# Patient Record
Sex: Female | Born: 1955 | Race: White | Hispanic: No | Marital: Married | Smoking: Former smoker
Health system: Southern US, Community
[De-identification: ages and names within clinical notes are randomized; demographics above are authoritative.]

## PROBLEM LIST (undated history)

## (undated) DIAGNOSIS — N903 Dysplasia of vulva, unspecified: Secondary | ICD-10-CM

## (undated) DIAGNOSIS — S060X9A Concussion with loss of consciousness of unspecified duration, initial encounter: Secondary | ICD-10-CM

## (undated) DIAGNOSIS — S6990XA Unspecified injury of unspecified wrist, hand and finger(s), initial encounter: Secondary | ICD-10-CM

## (undated) DIAGNOSIS — R079 Chest pain, unspecified: Secondary | ICD-10-CM

## (undated) DIAGNOSIS — S5400XA Injury of ulnar nerve at forearm level, unspecified arm, initial encounter: Secondary | ICD-10-CM

## (undated) DIAGNOSIS — S060XAA Concussion with loss of consciousness status unknown, initial encounter: Secondary | ICD-10-CM

## (undated) DIAGNOSIS — S62109A Fracture of unspecified carpal bone, unspecified wrist, initial encounter for closed fracture: Secondary | ICD-10-CM

## (undated) DIAGNOSIS — E7800 Pure hypercholesterolemia, unspecified: Secondary | ICD-10-CM

## (undated) HISTORY — PX: LASER ABLATION OF THE CERVIX: SHX1949

## (undated) HISTORY — PX: OTHER SURGICAL HISTORY: SHX169

## (undated) HISTORY — DX: Concussion with loss of consciousness status unknown, initial encounter: S06.0XAA

## (undated) HISTORY — PX: TONSILLECTOMY AND ADENOIDECTOMY: SUR1326

## (undated) HISTORY — DX: Unspecified injury of unspecified wrist, hand and finger(s), initial encounter: S69.90XA

## (undated) HISTORY — DX: Fracture of unspecified carpal bone, unspecified wrist, initial encounter for closed fracture: S62.109A

## (undated) HISTORY — DX: Concussion with loss of consciousness of unspecified duration, initial encounter: S06.0X9A

## (undated) HISTORY — DX: Injury of ulnar nerve at forearm level, unspecified arm, initial encounter: S54.00XA

## (undated) HISTORY — PX: APPENDECTOMY: SHX54

## (undated) HISTORY — DX: Pure hypercholesterolemia, unspecified: E78.00

## (undated) HISTORY — DX: Chest pain, unspecified: R07.9

## (undated) HISTORY — PX: COLONOSCOPY: SHX174

## (undated) HISTORY — DX: Dysplasia of vulva, unspecified: N90.3

---

## 1999-10-20 ENCOUNTER — Encounter: Payer: Self-pay | Admitting: Obstetrics and Gynecology

## 1999-10-20 ENCOUNTER — Encounter: Admission: RE | Admit: 1999-10-20 | Discharge: 1999-10-20 | Payer: Self-pay | Admitting: Obstetrics and Gynecology

## 1999-11-18 ENCOUNTER — Encounter (INDEPENDENT_AMBULATORY_CARE_PROVIDER_SITE_OTHER): Payer: Self-pay | Admitting: Specialist

## 1999-11-18 ENCOUNTER — Other Ambulatory Visit: Admission: RE | Admit: 1999-11-18 | Discharge: 1999-11-18 | Payer: Self-pay | Admitting: Obstetrics and Gynecology

## 2004-02-05 ENCOUNTER — Other Ambulatory Visit: Admission: RE | Admit: 2004-02-05 | Discharge: 2004-02-05 | Payer: Self-pay | Admitting: Obstetrics and Gynecology

## 2004-02-08 ENCOUNTER — Encounter: Admission: RE | Admit: 2004-02-08 | Discharge: 2004-02-08 | Payer: Self-pay | Admitting: Obstetrics and Gynecology

## 2005-03-02 ENCOUNTER — Encounter: Admission: RE | Admit: 2005-03-02 | Discharge: 2005-03-02 | Payer: Self-pay | Admitting: Obstetrics and Gynecology

## 2005-03-09 ENCOUNTER — Other Ambulatory Visit: Admission: RE | Admit: 2005-03-09 | Discharge: 2005-03-09 | Payer: Self-pay | Admitting: Obstetrics and Gynecology

## 2006-12-26 ENCOUNTER — Encounter: Admission: RE | Admit: 2006-12-26 | Discharge: 2006-12-26 | Payer: Self-pay | Admitting: Obstetrics and Gynecology

## 2007-01-02 ENCOUNTER — Other Ambulatory Visit: Admission: RE | Admit: 2007-01-02 | Discharge: 2007-01-02 | Payer: Self-pay | Admitting: Obstetrics and Gynecology

## 2009-01-27 ENCOUNTER — Encounter: Admission: RE | Admit: 2009-01-27 | Discharge: 2009-01-27 | Payer: Self-pay | Admitting: Obstetrics and Gynecology

## 2009-02-01 ENCOUNTER — Other Ambulatory Visit: Admission: RE | Admit: 2009-02-01 | Discharge: 2009-02-01 | Payer: Self-pay | Admitting: Obstetrics and Gynecology

## 2010-04-14 ENCOUNTER — Ambulatory Visit (HOSPITAL_COMMUNITY): Admission: RE | Admit: 2010-04-14 | Discharge: 2010-04-14 | Payer: Self-pay | Admitting: Internal Medicine

## 2010-04-14 ENCOUNTER — Encounter: Admission: RE | Admit: 2010-04-14 | Discharge: 2010-04-14 | Payer: Self-pay | Admitting: Internal Medicine

## 2010-06-21 ENCOUNTER — Other Ambulatory Visit
Admission: RE | Admit: 2010-06-21 | Discharge: 2010-06-21 | Payer: Self-pay | Source: Home / Self Care | Admitting: Obstetrics and Gynecology

## 2010-07-07 ENCOUNTER — Encounter
Admission: RE | Admit: 2010-07-07 | Discharge: 2010-07-07 | Payer: Self-pay | Source: Home / Self Care | Attending: Obstetrics and Gynecology | Admitting: Obstetrics and Gynecology

## 2011-06-14 ENCOUNTER — Other Ambulatory Visit: Payer: Self-pay | Admitting: Family Medicine

## 2011-06-14 ENCOUNTER — Other Ambulatory Visit (HOSPITAL_COMMUNITY)
Admission: RE | Admit: 2011-06-14 | Discharge: 2011-06-14 | Disposition: A | Discharge status: Home / Self Care | Payer: BC Managed Care – PPO | Source: Ambulatory Visit | Attending: Family Medicine | Admitting: Family Medicine

## 2011-06-14 DIAGNOSIS — Z1159 Encounter for screening for other viral diseases: Secondary | ICD-10-CM | POA: Insufficient documentation

## 2011-06-14 DIAGNOSIS — Z124 Encounter for screening for malignant neoplasm of cervix: Secondary | ICD-10-CM | POA: Insufficient documentation

## 2011-07-05 ENCOUNTER — Other Ambulatory Visit: Payer: Self-pay | Admitting: Family Medicine

## 2011-07-05 DIAGNOSIS — Z1231 Encounter for screening mammogram for malignant neoplasm of breast: Secondary | ICD-10-CM

## 2011-07-25 ENCOUNTER — Ambulatory Visit
Admission: RE | Admit: 2011-07-25 | Discharge: 2011-07-25 | Disposition: A | Discharge status: Home / Self Care | Payer: BC Managed Care – PPO | Source: Ambulatory Visit | Attending: Family Medicine | Admitting: Family Medicine

## 2011-07-25 DIAGNOSIS — Z1231 Encounter for screening mammogram for malignant neoplasm of breast: Secondary | ICD-10-CM

## 2012-07-03 ENCOUNTER — Other Ambulatory Visit: Payer: Self-pay | Admitting: Family Medicine

## 2012-07-03 ENCOUNTER — Other Ambulatory Visit (HOSPITAL_COMMUNITY)
Admission: RE | Admit: 2012-07-03 | Discharge: 2012-07-03 | Disposition: A | Discharge status: Home / Self Care | Payer: 59 | Source: Ambulatory Visit | Attending: Family Medicine | Admitting: Family Medicine

## 2012-07-03 DIAGNOSIS — Z124 Encounter for screening for malignant neoplasm of cervix: Secondary | ICD-10-CM | POA: Insufficient documentation

## 2012-09-10 ENCOUNTER — Other Ambulatory Visit: Payer: Self-pay

## 2012-09-10 DIAGNOSIS — Z1231 Encounter for screening mammogram for malignant neoplasm of breast: Secondary | ICD-10-CM

## 2012-10-09 ENCOUNTER — Ambulatory Visit
Admission: RE | Admit: 2012-10-09 | Discharge: 2012-10-09 | Disposition: A | Discharge status: Home / Self Care | Payer: 59 | Source: Ambulatory Visit

## 2012-10-09 DIAGNOSIS — Z1231 Encounter for screening mammogram for malignant neoplasm of breast: Secondary | ICD-10-CM

## 2013-07-07 ENCOUNTER — Other Ambulatory Visit: Payer: Self-pay | Admitting: Family Medicine

## 2013-07-07 ENCOUNTER — Other Ambulatory Visit (HOSPITAL_COMMUNITY)
Admission: RE | Admit: 2013-07-07 | Discharge: 2013-07-07 | Disposition: A | Discharge status: Home / Self Care | Payer: 59 | Source: Ambulatory Visit | Attending: Family Medicine | Admitting: Family Medicine

## 2013-07-07 DIAGNOSIS — Z1151 Encounter for screening for human papillomavirus (HPV): Secondary | ICD-10-CM | POA: Insufficient documentation

## 2013-07-07 DIAGNOSIS — Z124 Encounter for screening for malignant neoplasm of cervix: Secondary | ICD-10-CM | POA: Insufficient documentation

## 2014-01-29 ENCOUNTER — Other Ambulatory Visit: Payer: Self-pay

## 2014-01-29 DIAGNOSIS — Z1231 Encounter for screening mammogram for malignant neoplasm of breast: Secondary | ICD-10-CM

## 2014-02-02 ENCOUNTER — Ambulatory Visit
Admission: RE | Admit: 2014-02-02 | Discharge: 2014-02-02 | Disposition: A | Discharge status: Home / Self Care | Payer: 59 | Source: Ambulatory Visit

## 2014-02-02 DIAGNOSIS — Z1231 Encounter for screening mammogram for malignant neoplasm of breast: Secondary | ICD-10-CM

## 2014-02-09 ENCOUNTER — Other Ambulatory Visit: Payer: Self-pay | Admitting: Physician Assistant

## 2014-02-09 DIAGNOSIS — R109 Unspecified abdominal pain: Secondary | ICD-10-CM

## 2014-02-11 ENCOUNTER — Encounter (INDEPENDENT_AMBULATORY_CARE_PROVIDER_SITE_OTHER): Payer: Self-pay

## 2014-02-11 ENCOUNTER — Ambulatory Visit
Admission: RE | Admit: 2014-02-11 | Discharge: 2014-02-11 | Disposition: A | Discharge status: Home / Self Care | Payer: 59 | Source: Ambulatory Visit | Attending: Physician Assistant | Admitting: Physician Assistant

## 2014-02-11 DIAGNOSIS — R109 Unspecified abdominal pain: Secondary | ICD-10-CM

## 2014-02-27 ENCOUNTER — Other Ambulatory Visit: Payer: Self-pay | Admitting: Obstetrics & Gynecology

## 2014-07-01 ENCOUNTER — Other Ambulatory Visit: Payer: Self-pay | Admitting: Obstetrics & Gynecology

## 2014-07-01 ENCOUNTER — Other Ambulatory Visit (HOSPITAL_COMMUNITY)
Admission: RE | Admit: 2014-07-01 | Discharge: 2014-07-01 | Disposition: A | Discharge status: Home / Self Care | Payer: Managed Care, Other (non HMO) | Source: Ambulatory Visit | Attending: Obstetrics & Gynecology | Admitting: Obstetrics & Gynecology

## 2014-07-01 DIAGNOSIS — Z01419 Encounter for gynecological examination (general) (routine) without abnormal findings: Secondary | ICD-10-CM | POA: Insufficient documentation

## 2014-07-02 LAB — CYTOLOGY - PAP

## 2015-01-20 ENCOUNTER — Telehealth (HOSPITAL_COMMUNITY): Payer: Self-pay

## 2015-01-20 ENCOUNTER — Encounter: Payer: Self-pay | Admitting: Cardiology

## 2015-01-20 ENCOUNTER — Ambulatory Visit (INDEPENDENT_AMBULATORY_CARE_PROVIDER_SITE_OTHER): Payer: Managed Care, Other (non HMO) | Admitting: Cardiology

## 2015-01-20 VITALS — BP 120/70 | HR 52 | Ht 67.0 in | Wt 136.2 lb

## 2015-01-20 DIAGNOSIS — R001 Bradycardia, unspecified: Secondary | ICD-10-CM

## 2015-01-20 DIAGNOSIS — I341 Nonrheumatic mitral (valve) prolapse: Secondary | ICD-10-CM | POA: Diagnosis not present

## 2015-01-20 DIAGNOSIS — R002 Palpitations: Secondary | ICD-10-CM

## 2015-01-20 DIAGNOSIS — R079 Chest pain, unspecified: Secondary | ICD-10-CM | POA: Diagnosis not present

## 2015-01-20 DIAGNOSIS — Z8249 Family history of ischemic heart disease and other diseases of the circulatory system: Secondary | ICD-10-CM | POA: Diagnosis not present

## 2015-01-20 DIAGNOSIS — E785 Hyperlipidemia, unspecified: Secondary | ICD-10-CM | POA: Insufficient documentation

## 2015-01-20 NOTE — Progress Notes (Signed)
Cardiology Office Note   Date:  01/20/2015   ID:  Carol Kerr, DOB 1955-10-31, MRN [id number]  PCP:  No primary care provider on file.    Chief Complaint  Patient presents with  . New Evaluation    Family Hx of premature CAD      History of Present Illness: Carol Kerr is a 59 y.o. female who presents for evaluation of atypical chest pain.  She has a family history of premature CAD with her mom having an MI at 67.  She also has a brother who had an MI at 10.  She has a history of dyslipidemia and has a remote history of tobacco use.  She describes her pain as sharp and intermittent. She says that she only had the pain on 1 occasion.  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  She went to the school nurse and BP was 140/26mmhg. It was sharp and took her breath away.  She says it was very severe and was hard to take a deep breath in.  She became diaphoretic but no nausea.  She went to see her PCP.  She has not had any further CP.  She denies any SOB or DOE.  She runs 2-3 times weekly 3-6 miles and never has any problems doing that.  She has noticed on occasion that when she is running or walking her legs will feel weak.  She denies any LE edema.  She rarely has some skipped heart beats and was diagnosed with MVP years ago.  The palpitations occur a few times weekly.      Past Medical History  Diagnosis Date  . Hypercholesterolemia   . Vulvar dysplasia   . Concussion     FROM A FALL  . 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 Finger injury     FROM A FALL  . Fracture of wrist   . Chest pain     Past Surgical History  Procedure Laterality Date  . Appendectomy    . Tonsillectomy and adenoidectomy    . Laser ablation of the cervix    . Laser to vular    . Colonoscopy       Current Outpatient Prescriptions  Medication Sig Dispense Refill  . Multiple Vitamin (MULTIVITAMIN) tablet Take 1 tablet by mouth daily.    Marland Kitchen triamcinolone cream (KENALOG) 0.1 %  Apply 1 application topically daily.     No current facility-administered medications for this visit.    Allergies:   Statins and Xylocaine    Social History:  The patient  reports that she quit smoking about 43 years ago. Her smoking use included Cigarettes. She does not have any smokeless tobacco history on file. She reports that she drinks alcohol.   Family History:  The patient's family history includes Cancer in her father and maternal grandmother; Heart attack in her brother and mother; Hypertension in her mother.    ROS:  Please see the history of present illness.   Otherwise, review of systems are positive for none.   All other systems are reviewed and negative.    PHYSICAL EXAM: VS:  BP 120/70 mmHg  Pulse 52  Ht  (1.702 m)  Wt 136 lb 3.2 oz (61.78 kg)  BMI 21.33 kg/m2 , BMI Body mass index is 21.33 kg/(m^2). GEN: Well nourished, well developed, in no acute distress  HEENT: normal Neck: no JVD, carotid bruits, or masses Cardiac: RRR; no murmurs, rubs, or gallops,no edema  Respiratory:  clear to auscultation bilaterally, normal work of breathing GI: soft, nontender, nondistended, + BS MS: no deformity or atrophy Skin: warm and dry, no rash Neuro:  Strength and sensation are intact Psych: euthymic mood, full affect   EKG:  EKG is ordered today. The ekg ordered today demonstrates sinus bradycardia at 52bpm with no ST changes   Recent Labs: No results found for requested labs within last 365 days.    Lipid Panel No results found for: CHOL, TRIG, HDL, CHOLHDL, VLDL, LDLCALC, LDLDIRECT    Wt Readings from Last 3 Encounters:  01/20/15 136 lb 3.2 oz (61.78 kg)      ASSESSMENT AND PLAN:  1.  Atypical chest pain that is right sided.  EKG is nonischemic.  She does have risk factors including HTN, dyslipidemia and strong family history of premature CAD.  I will get a stress myoview to rule out ischemia.  Check 2D echo to evaluate LVF. 2.  Dyslipidemia - statin  intolerant - followed by PCP.  She would like to have an NMR panel done so I will order this. 3.  Asymptomatic bradycardia 4.  Palpitations - these occur a few times weekly - I will get a 30 day monitor to rule out PAF    Current medicines are reviewed at length with the patient today.  The patient does not have concerns regarding medicines.  The following changes have been made:  no change  Labs/ tests ordered today: See above Assessment and Plan No orders of the defined types were placed in this encounter.     Disposition:   FU with me in 1 year if studies are normal  Harlon Flor, MD  01/20/2015 12:01 PM    [facility] [address] [place], [place], [place]  [address] Phone: [phone]; Fax: [phone]

## 2015-01-20 NOTE — Telephone Encounter (Signed)
Patient given detailed instructions per Myocardial Perfusion Study Information Sheet for test on 01-25-2015 at 0845. Patient Notified to arrive 15 minutes early, and that it is imperative to arrive on time for appointment to keep from having the test rescheduled. Patient verbalized understanding. Randa Evens, Mirta Mally A

## 2015-01-20 NOTE — Patient Instructions (Signed)
Medication Instructions:  Your physician recommends that you continue on your current medications as directed. Please refer to the Current Medication list given to you today.   Labwork: You will get FASTING LAB WORK the day of your stress test. (NMR)  Testing/Procedures: Your physician has requested that you have an echocardiogram. Echocardiography is a painless test that uses sound waves to create images of your heart. It provides your doctor with information about the size and shape of your heart and how well your heart's chambers and valves are working. This procedure takes approximately one hour. There are no restrictions for this procedure.   Dr. Mayford Knife recommends you have an EXERCISE MYOVIEW.  Your physician has recommended that you wear an event monitor. Event monitors are medical devices that record the heart's electrical activity. Doctors most often [place] these monitors to diagnose arrhythmias. Arrhythmias are problems with the speed or rhythm of the heartbeat. The monitor is a small, portable device. You can wear one while you do your normal daily activities. This is usually used to diagnose what is causing palpitations/syncope (passing out).  Follow-Up: Your physician wants you to follow-up in: 1 year with Dr. Mayford Knife. You will receive a reminder letter in the mail two months in advance. If you don't receive a letter, please call our office to schedule the follow-up appointment.   Any Other Special Instructions Will Be Listed Below (If Applicable).

## 2015-01-21 ENCOUNTER — Other Ambulatory Visit: Payer: Self-pay | Admitting: Cardiology

## 2015-01-21 DIAGNOSIS — R001 Bradycardia, unspecified: Secondary | ICD-10-CM

## 2015-01-21 DIAGNOSIS — R002 Palpitations: Secondary | ICD-10-CM

## 2015-01-25 ENCOUNTER — Other Ambulatory Visit (INDEPENDENT_AMBULATORY_CARE_PROVIDER_SITE_OTHER): Payer: Managed Care, Other (non HMO) | Admitting: *Deleted

## 2015-01-25 ENCOUNTER — Ambulatory Visit (INDEPENDENT_AMBULATORY_CARE_PROVIDER_SITE_OTHER): Payer: Managed Care, Other (non HMO)

## 2015-01-25 ENCOUNTER — Ambulatory Visit (HOSPITAL_COMMUNITY): Payer: Managed Care, Other (non HMO) | Attending: Cardiovascular Disease

## 2015-01-25 ENCOUNTER — Encounter (HOSPITAL_COMMUNITY): Payer: Managed Care, Other (non HMO)

## 2015-01-25 ENCOUNTER — Other Ambulatory Visit: Payer: Self-pay

## 2015-01-25 DIAGNOSIS — E785 Hyperlipidemia, unspecified: Secondary | ICD-10-CM | POA: Diagnosis not present

## 2015-01-25 DIAGNOSIS — R002 Palpitations: Secondary | ICD-10-CM | POA: Diagnosis not present

## 2015-01-25 DIAGNOSIS — I341 Nonrheumatic mitral (valve) prolapse: Secondary | ICD-10-CM | POA: Diagnosis not present

## 2015-01-25 DIAGNOSIS — R001 Bradycardia, unspecified: Secondary | ICD-10-CM | POA: Diagnosis not present

## 2015-01-26 ENCOUNTER — Telehealth (HOSPITAL_COMMUNITY): Payer: Self-pay | Admitting: *Deleted

## 2015-01-26 ENCOUNTER — Telehealth: Payer: Self-pay

## 2015-01-26 DIAGNOSIS — R079 Chest pain, unspecified: Secondary | ICD-10-CM

## 2015-01-26 NOTE — Telephone Encounter (Signed)
Myoview denied by insurance.  Per Dr. Mayford Knife, GXT ordered instead.

## 2015-01-27 ENCOUNTER — Encounter: Payer: Self-pay | Admitting: Cardiology

## 2015-01-27 LAB — NMR LIPOPROFILE WITH LIPIDS
Cholesterol, Total: 222 mg/dL — ABNORMAL HIGH (ref 100–199)
HDL Particle Number: 35.2 umol/L (ref >=30.5)
HDL Size: 9.8 nm (ref >=9.2)
HDL-C: 74 mg/dL (ref >39)
LDL (calc): 135 mg/dL — ABNORMAL HIGH (ref 0–99)
LDL Particle Number: 1215 nmol/L — ABNORMAL HIGH (ref <1000)
LDL Size: 21.9 nm (ref >=20.8)
LP-IR Score: <25 (ref <=45)
Large HDL-P: 12.1 umol/L (ref >=4.8)
Large VLDL-P: 1.8 nmol/L (ref <=2.7)
Small LDL Particle Number: 129 nmol/L (ref <=527)
Triglycerides: 65 mg/dL (ref 0–149)
VLDL Size: 40.6 nm (ref <=46.6)

## 2015-01-28 ENCOUNTER — Encounter: Payer: Self-pay | Admitting: Cardiology

## 2015-01-28 NOTE — Telephone Encounter (Signed)
This encounter was created in error - please disregard.

## 2015-01-28 NOTE — Telephone Encounter (Signed)
Follow up  Pt returning Katy's phone call concerning lab results./ If possible please call back after 2:15pm (8/4)  pt will be available then.

## 2015-02-03 NOTE — Telephone Encounter (Deleted)
Error

## 2015-02-16 ENCOUNTER — Telehealth (HOSPITAL_COMMUNITY): Payer: Self-pay

## 2015-02-16 NOTE — Telephone Encounter (Signed)
Encounter complete. 

## 2015-02-17 ENCOUNTER — Telehealth (HOSPITAL_COMMUNITY): Payer: Self-pay

## 2015-02-17 NOTE — Telephone Encounter (Signed)
Encounter complete. 

## 2015-02-18 ENCOUNTER — Ambulatory Visit (HOSPITAL_COMMUNITY)
Admission: RE | Admit: 2015-02-18 | Discharge: 2015-02-18 | Disposition: A | Discharge status: Home / Self Care | Payer: Managed Care, Other (non HMO) | Source: Ambulatory Visit | Attending: Cardiology | Admitting: Cardiology

## 2015-02-18 DIAGNOSIS — R079 Chest pain, unspecified: Secondary | ICD-10-CM | POA: Diagnosis not present

## 2015-02-18 LAB — EXERCISE TOLERANCE TEST
Estimated workload: 17.2 METS
Exercise duration (min): 15 min
Exercise duration (sec): 1 s
MPHR: 162 {beats}/min
Peak HR: 155 {beats}/min
Percent HR: 95 %
RPE: 15
Rest HR: 51 {beats}/min

## 2015-05-03 ENCOUNTER — Other Ambulatory Visit: Payer: Self-pay

## 2015-05-03 DIAGNOSIS — Z1231 Encounter for screening mammogram for malignant neoplasm of breast: Secondary | ICD-10-CM

## 2015-05-26 ENCOUNTER — Ambulatory Visit
Admission: RE | Admit: 2015-05-26 | Discharge: 2015-05-26 | Disposition: A | Discharge status: Home / Self Care | Payer: Managed Care, Other (non HMO) | Source: Ambulatory Visit

## 2015-05-26 DIAGNOSIS — Z1231 Encounter for screening mammogram for malignant neoplasm of breast: Secondary | ICD-10-CM

## 2015-05-27 ENCOUNTER — Other Ambulatory Visit: Payer: Self-pay | Admitting: Family Medicine

## 2015-05-27 DIAGNOSIS — R928 Other abnormal and inconclusive findings on diagnostic imaging of breast: Secondary | ICD-10-CM

## 2015-06-02 ENCOUNTER — Other Ambulatory Visit: Payer: Managed Care, Other (non HMO)

## 2015-06-02 ENCOUNTER — Ambulatory Visit
Admission: RE | Admit: 2015-06-02 | Discharge: 2015-06-02 | Disposition: A | Discharge status: Home / Self Care | Payer: Managed Care, Other (non HMO) | Source: Ambulatory Visit | Attending: Family Medicine | Admitting: Family Medicine

## 2015-06-02 DIAGNOSIS — R928 Other abnormal and inconclusive findings on diagnostic imaging of breast: Secondary | ICD-10-CM

## 2016-07-14 ENCOUNTER — Other Ambulatory Visit: Payer: Self-pay | Admitting: Family Medicine

## 2016-07-14 DIAGNOSIS — Z1231 Encounter for screening mammogram for malignant neoplasm of breast: Secondary | ICD-10-CM

## 2016-08-10 ENCOUNTER — Ambulatory Visit
Admission: RE | Admit: 2016-08-10 | Discharge: 2016-08-10 | Disposition: A | Discharge status: Home / Self Care | Payer: Managed Care, Other (non HMO) | Source: Ambulatory Visit | Attending: Family Medicine | Admitting: Family Medicine

## 2016-08-10 DIAGNOSIS — Z1231 Encounter for screening mammogram for malignant neoplasm of breast: Secondary | ICD-10-CM

## 2017-07-31 ENCOUNTER — Other Ambulatory Visit: Payer: Self-pay | Admitting: Family Medicine

## 2017-07-31 DIAGNOSIS — Z1231 Encounter for screening mammogram for malignant neoplasm of breast: Secondary | ICD-10-CM

## 2017-08-21 ENCOUNTER — Ambulatory Visit
Admission: RE | Admit: 2017-08-21 | Discharge: 2017-08-21 | Disposition: A | Discharge status: Home / Self Care | Payer: Managed Care, Other (non HMO) | Source: Ambulatory Visit | Attending: Family Medicine | Admitting: Family Medicine

## 2017-08-21 DIAGNOSIS — Z1231 Encounter for screening mammogram for malignant neoplasm of breast: Secondary | ICD-10-CM

## 2019-05-27 ENCOUNTER — Other Ambulatory Visit: Payer: Self-pay | Admitting: Family Medicine

## 2019-05-27 DIAGNOSIS — Z1231 Encounter for screening mammogram for malignant neoplasm of breast: Secondary | ICD-10-CM

## 2019-06-03 ENCOUNTER — Other Ambulatory Visit: Payer: Self-pay

## 2019-06-03 ENCOUNTER — Ambulatory Visit
Admission: RE | Admit: 2019-06-03 | Discharge: 2019-06-03 | Disposition: A | Discharge status: Home / Self Care | Payer: Managed Care, Other (non HMO) | Source: Ambulatory Visit | Attending: Family Medicine | Admitting: Family Medicine

## 2019-06-03 DIAGNOSIS — Z1231 Encounter for screening mammogram for malignant neoplasm of breast: Secondary | ICD-10-CM

## 2019-07-07 ENCOUNTER — Other Ambulatory Visit: Payer: Managed Care, Other (non HMO)

## 2020-11-23 ENCOUNTER — Other Ambulatory Visit: Payer: Self-pay | Admitting: Family Medicine

## 2020-11-23 DIAGNOSIS — Z1231 Encounter for screening mammogram for malignant neoplasm of breast: Secondary | ICD-10-CM

## 2020-11-25 ENCOUNTER — Other Ambulatory Visit: Payer: Self-pay

## 2020-11-25 ENCOUNTER — Ambulatory Visit
Admission: RE | Admit: 2020-11-25 | Discharge: 2020-11-25 | Disposition: A | Discharge status: Home / Self Care | Payer: Managed Care, Other (non HMO) | Source: Ambulatory Visit | Attending: Family Medicine | Admitting: Family Medicine

## 2020-11-25 DIAGNOSIS — Z1231 Encounter for screening mammogram for malignant neoplasm of breast: Secondary | ICD-10-CM

## 2021-09-28 IMAGING — MG MM DIGITAL SCREENING BILAT W/ TOMO AND CAD
8 series · 8 of 24 positions shown · non-contrast
Comparison: Previous exam(s).

CLINICAL DATA: Screening.

EXAM:
DIGITAL SCREENING BILATERAL MAMMOGRAM WITH TOMOSYNTHESIS AND CAD
TECHNIQUE: Bilateral screening digital craniocaudal and mediolateral oblique
mammograms were obtained. Bilateral screening digital breast
tomosynthesis was performed. The images were evaluated with
computer-aided detection.

[R CC synth-2D]
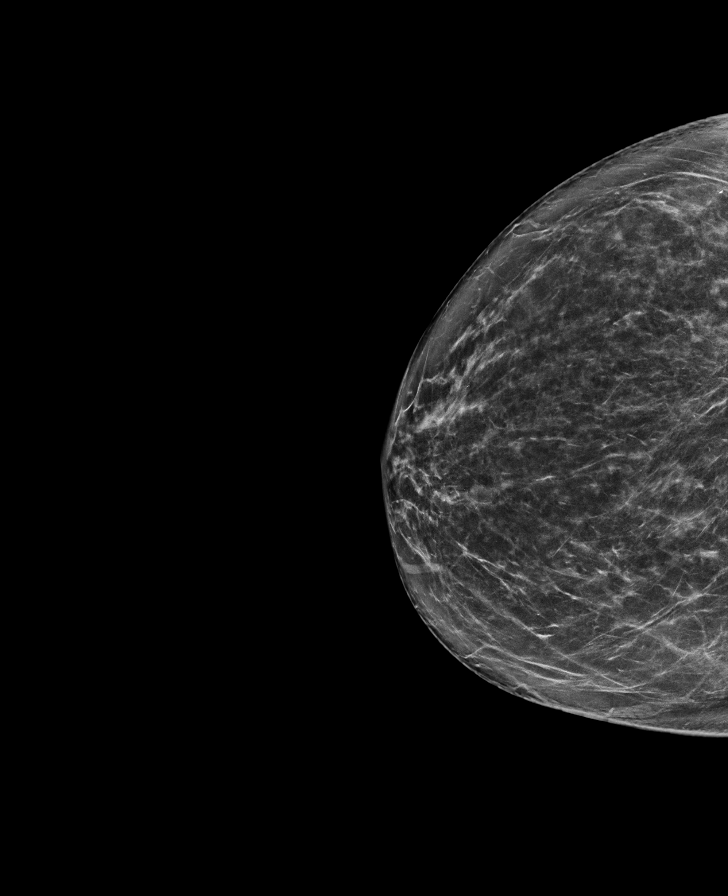

[R MLO synth-2D]
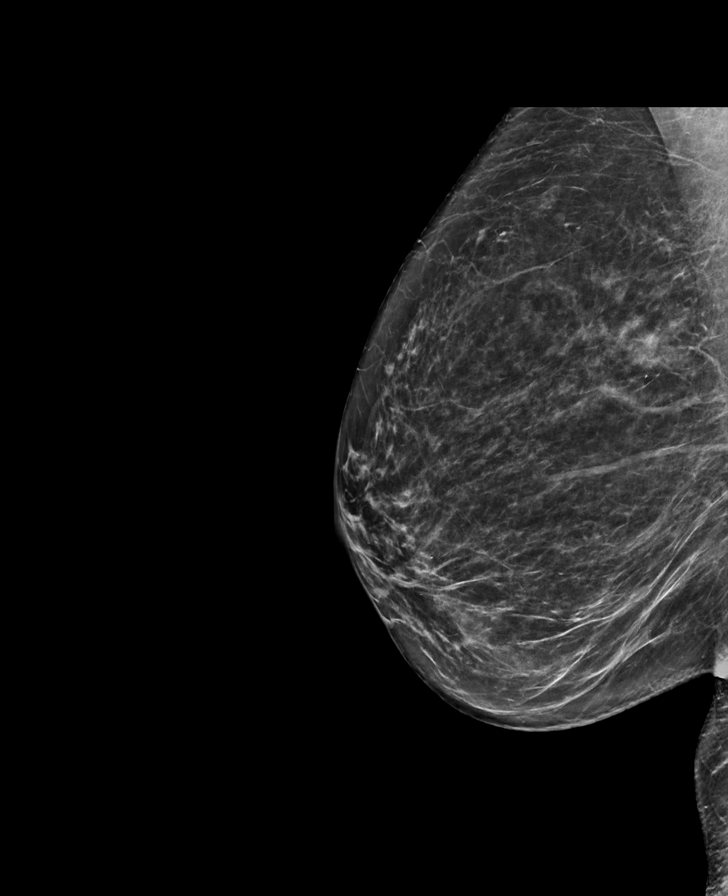

[L MLO synth-2D]
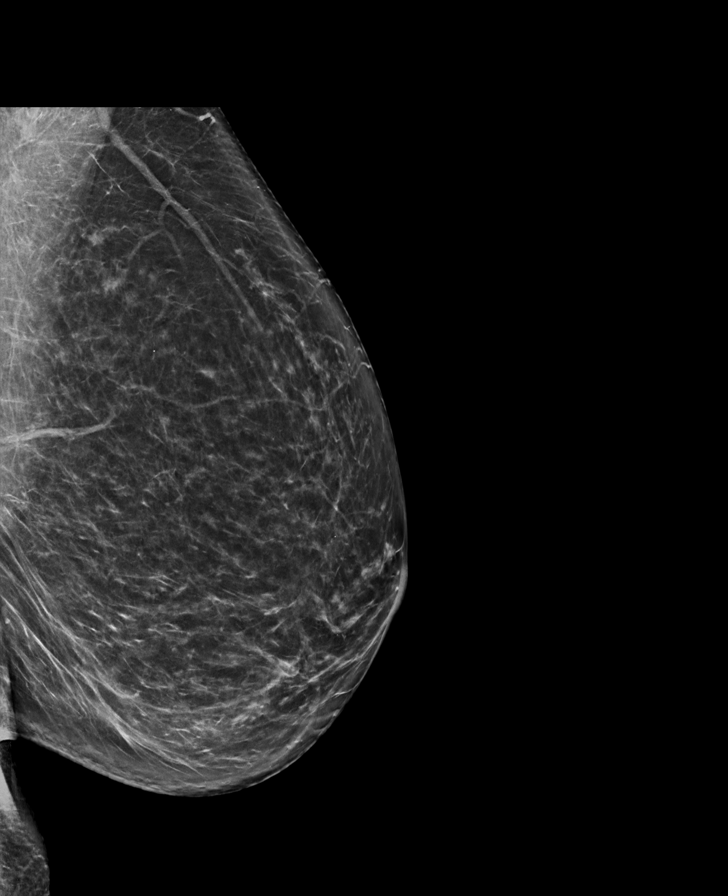

[L CC synth-2D]
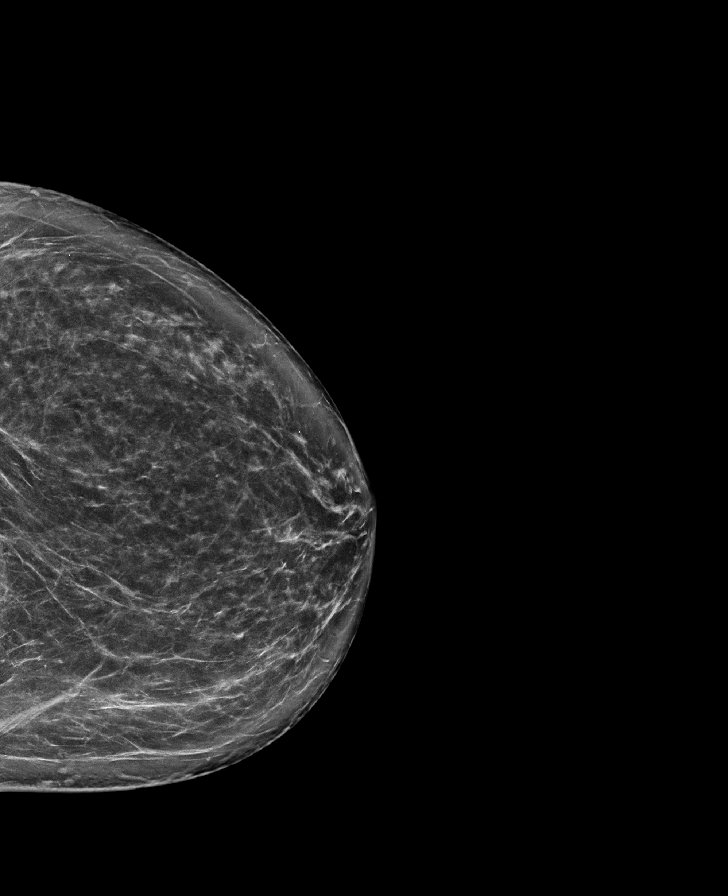

[R CC tomo · tomo slice 39/77.0]
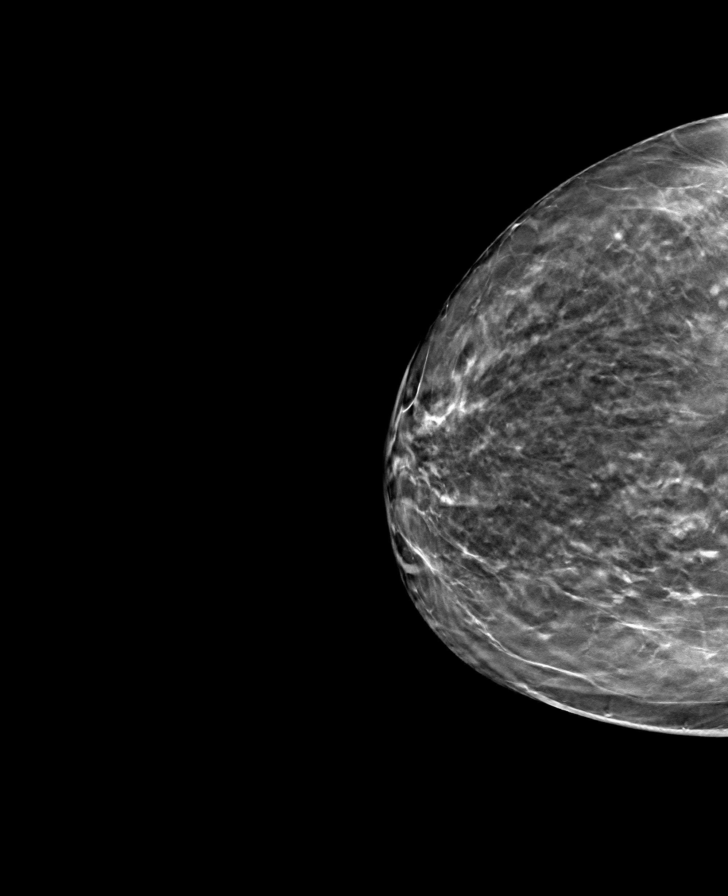

[R MLO tomo · tomo slice 39/77.0]
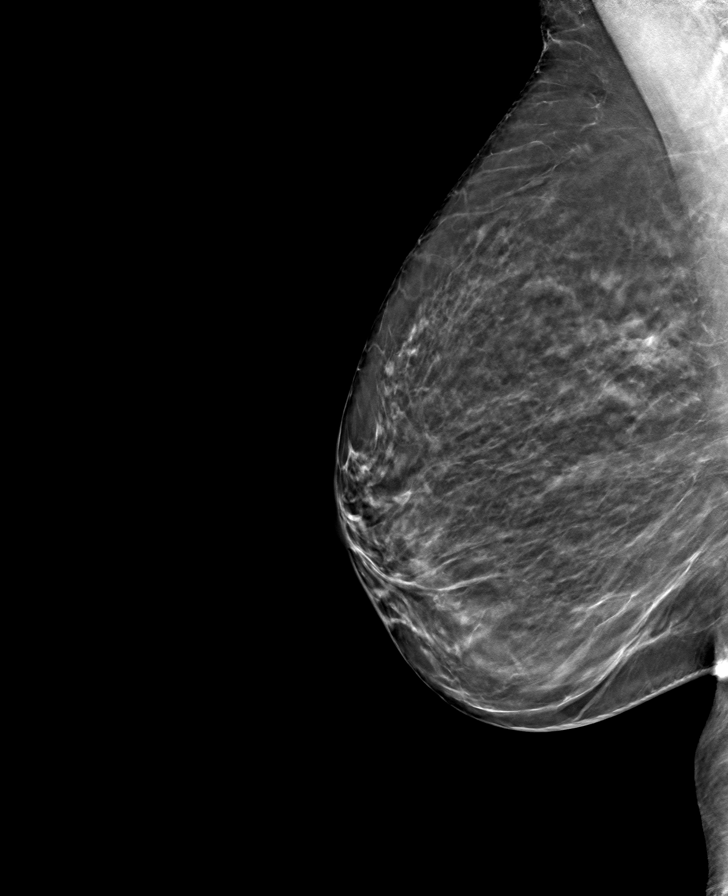

[L MLO tomo · tomo slice 40/79.0]
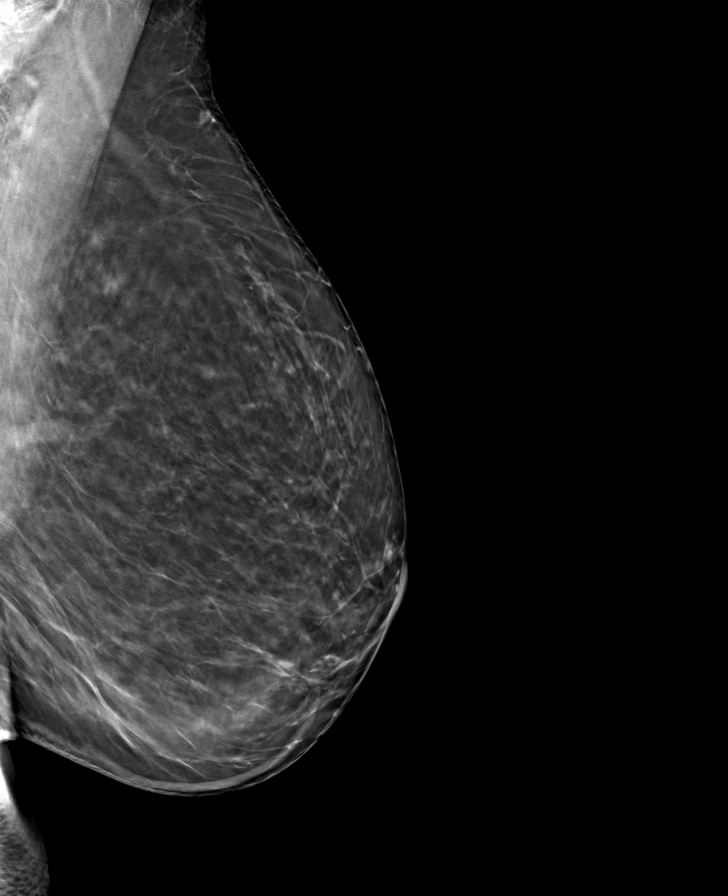

[L CC tomo · tomo slice 43/86.0]
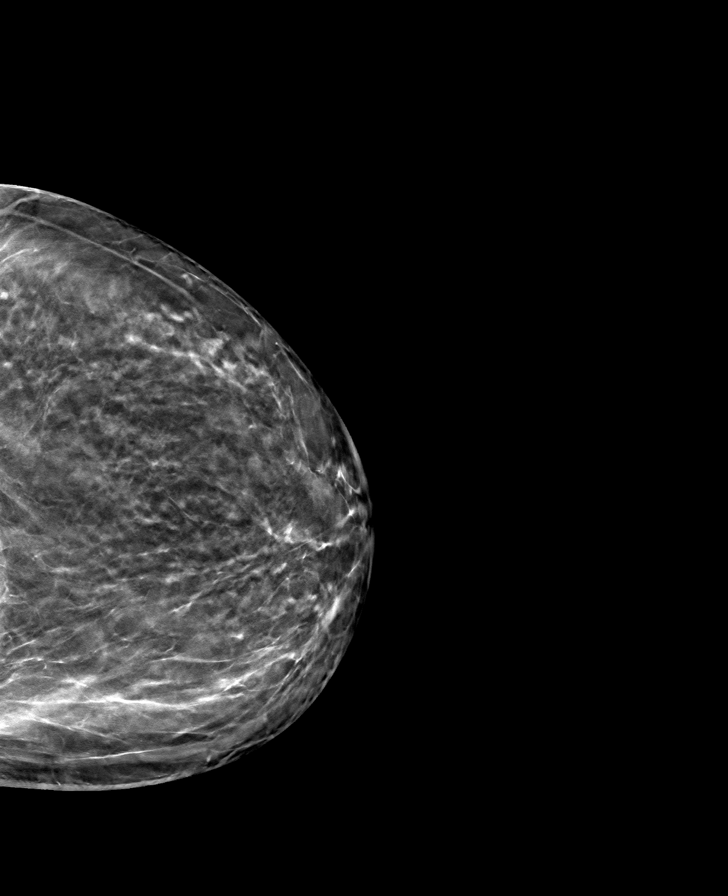

[8 of 24 positions shown; findings below may reference images not displayed]

ACR Breast Density Category c: The breast tissue is heterogeneously
dense, which may obscure small masses.
FINDINGS: There are no findings suspicious for malignancy. The images were
evaluated with computer-aided detection.
IMPRESSION: No mammographic evidence of malignancy. A result letter of this
screening mammogram will be mailed directly to the patient.

RECOMMENDATION:
Screening mammogram in one year. (Code:T4-5-GWO)

BI-RADS CATEGORY  1: Negative.

## 2022-01-10 ENCOUNTER — Other Ambulatory Visit: Payer: Self-pay | Admitting: Family Medicine

## 2022-01-10 DIAGNOSIS — Z1231 Encounter for screening mammogram for malignant neoplasm of breast: Secondary | ICD-10-CM

## 2022-01-13 ENCOUNTER — Ambulatory Visit
Admission: RE | Admit: 2022-01-13 | Discharge: 2022-01-13 | Disposition: A | Discharge status: Home / Self Care | Payer: Managed Care, Other (non HMO) | Source: Ambulatory Visit | Attending: Family Medicine | Admitting: Family Medicine

## 2022-01-13 DIAGNOSIS — Z1231 Encounter for screening mammogram for malignant neoplasm of breast: Secondary | ICD-10-CM

## 2022-08-08 ENCOUNTER — Other Ambulatory Visit (HOSPITAL_BASED_OUTPATIENT_CLINIC_OR_DEPARTMENT_OTHER): Payer: Self-pay

## 2022-08-08 ENCOUNTER — Other Ambulatory Visit: Payer: Self-pay

## 2022-08-08 ENCOUNTER — Encounter (HOSPITAL_BASED_OUTPATIENT_CLINIC_OR_DEPARTMENT_OTHER): Payer: Self-pay

## 2022-08-08 ENCOUNTER — Emergency Department (HOSPITAL_BASED_OUTPATIENT_CLINIC_OR_DEPARTMENT_OTHER): Payer: 59

## 2022-08-08 ENCOUNTER — Emergency Department (HOSPITAL_BASED_OUTPATIENT_CLINIC_OR_DEPARTMENT_OTHER)
Admission: EM | Admit: 2022-08-08 | Discharge: 2022-08-08 | Disposition: A | Discharge status: Home / Self Care | Payer: 59 | Attending: Emergency Medicine | Admitting: Emergency Medicine

## 2022-08-08 DIAGNOSIS — R079 Chest pain, unspecified: Secondary | ICD-10-CM | POA: Diagnosis not present

## 2022-08-08 DIAGNOSIS — Z87891 Personal history of nicotine dependence: Secondary | ICD-10-CM | POA: Insufficient documentation

## 2022-08-08 LAB — CBC
HCT: 43.6 % (ref 36.0–46.0)
Hemoglobin: 14.3 g/dL (ref 12.0–15.0)
MCH: 30.5 pg (ref 26.0–34.0)
MCHC: 32.8 g/dL (ref 30.0–36.0)
MCV: 93 fL (ref 80.0–100.0)
Platelets: 268 10*3/uL (ref 150–400)
RBC: 4.69 MIL/uL (ref 3.87–5.11)
RDW: 13.2 % (ref 11.5–15.5)
WBC: 4.4 10*3/uL (ref 4.0–10.5)
nRBC: 0 % (ref 0.0–0.2)

## 2022-08-08 LAB — BASIC METABOLIC PANEL
Anion gap: 9 (ref 5–15)
BUN: 14 mg/dL (ref 8–23)
CO2: 26 mmol/L (ref 22–32)
Calcium: 10 mg/dL (ref 8.9–10.3)
Chloride: 106 mmol/L (ref 98–111)
Creatinine, Ser: 0.84 mg/dL (ref 0.44–1.00)
GFR, Estimated: >60 mL/min (ref >60)
Glucose, Bld: 93 mg/dL (ref 70–99)
Potassium: 3.9 mmol/L (ref 3.5–5.1)
Sodium: 141 mmol/L (ref 135–145)

## 2022-08-08 LAB — TROPONIN I (HIGH SENSITIVITY)
Troponin I (High Sensitivity): 2 ng/L (ref <18)
Troponin I (High Sensitivity): 3 ng/L (ref <18)

## 2022-08-08 LAB — D-DIMER, QUANTITATIVE: D-Dimer, Quant: 0.41 ug/mL-FEU (ref 0.00–0.50)

## 2022-08-08 MED ORDER — PANTOPRAZOLE SODIUM 20 MG PO TBEC: 20.0000 mg | DELAYED_RELEASE_TABLET | Freq: Every day | ORAL | 1 refills | Status: DC

## 2022-08-08 MED ORDER — ALUM & MAG HYDROXIDE-SIMETH 200-200-20 MG/5ML PO SUSP
15.0000 mL | Freq: Once | ORAL | Status: AC
  Administered 2022-08-08: 15 mL via ORAL
  Filled 2022-08-08: qty 30

## 2022-08-08 MED ORDER — FAMOTIDINE 20 MG PO TABS
20.0000 mg | ORAL_TABLET | Freq: Once | ORAL | Status: AC
  Administered 2022-08-08: 20 mg via ORAL
  Filled 2022-08-08: qty 1

## 2022-08-08 NOTE — ED Provider Notes (Incomplete)
[facility] Provider Note   CSN: [id number] Arrival date & time: 08/08/22  1114     History {Add pertinent medical, surgical, social history, OB history to HPI:1} Chief Complaint  Patient presents with  . Chest Pain    Carol Kerr is a 67 y.o. female.   Chest Pain   67 year old female presents emergency department with complaints of chest pain.  Patient states that chest pain began this past Sunday after awakening.  Reports pain initially in epigastric region without radiation.  States she took a Pepcid due to concerns of it being "heartburn" of which helped some.  She notes that subsequently has had episodes at night in the morning described as left-sided chest pain.  Denies fever, cough, congestion, shortness of breath, abdominal pain, nausea, vomiting, urinary symptoms, change in bowel habits.  Denies any history of cardiac or pulmonary complications.  Denies history of DVT/PE, recent surgery/immobilization, known malignancy, coagulopathy.  Past medical history significant for hyperlipidemia  Home Medications Prior to Admission medications   Medication Sig Start Date End Date Taking? Authorizing Provider  Multiple Vitamin (MULTIVITAMIN) tablet Take 1 tablet by mouth daily.    [provider]  triamcinolone cream (KENALOG) 0.1 % Apply 1 application topically daily.    [provider]      Allergies    Statins and Xylocaine [lidocaine hcl]    Review of Systems   Review of Systems  Cardiovascular:  Positive for chest pain.  All other systems reviewed and are negative.   Physical Exam Updated Vital Signs BP (!) 164/101   Pulse 81   Temp 98.1 F (36.7 C) (Oral)   Resp 17   Ht 5' 7"$  (1.702 m)   Wt 63.5 kg   SpO2 100%   BMI 21.93 kg/m  Physical Exam Vitals and nursing note reviewed.  Constitutional:      General: She is not in acute distress.    Appearance: She is well-developed.  HENT:     Head:  Normocephalic and atraumatic.  Eyes:     Conjunctiva/sclera: Conjunctivae normal.  Cardiovascular:     Rate and Rhythm: Normal rate and regular rhythm.     Heart sounds: No murmur heard. Pulmonary:     Effort: Pulmonary effort is normal. No respiratory distress.     Breath sounds: Normal breath sounds. No wheezing, rhonchi or rales.  Abdominal:     Palpations: Abdomen is soft.     Tenderness: There is no abdominal tenderness. There is no right CVA tenderness or left CVA tenderness.  Musculoskeletal:        General: No swelling.     Cervical back: Neck supple.     Right lower leg: No edema.     Left lower leg: No edema.  Skin:    General: Skin is warm and dry.     Capillary Refill: Capillary refill takes less than 2 seconds.  Neurological:     Mental Status: She is alert.  Psychiatric:        Mood and Affect: Mood normal.     ED Results / Procedures / Treatments   Labs (all labs ordered are listed, but only abnormal results are displayed) Labs Reviewed  CBC  BASIC METABOLIC PANEL  D-DIMER, QUANTITATIVE  TROPONIN I (HIGH SENSITIVITY)    EKG None  Radiology No results found.  Procedures Procedures  {Document cardiac monitor, telemetry assessment procedure when appropriate:1}  Medications Ordered in ED Medications  alum & mag  hydroxide-simeth (MAALOX/MYLANTA) 200-200-20 MG/5ML suspension 15 mL (has no administration in time range)  famotidine (PEPCID) tablet 20 mg (has no administration in time range)    ED Course/ Medical Decision Making/ A&P   {   Click here for ABCD2, HEART and other calculatorsREFRESH Note before signing :1}                          Medical Decision Making Amount and/or Complexity of Data Reviewed Labs: ordered. Radiology: ordered.  Risk OTC drugs.   This patient presents to the ED for concern of ***, this involves an extensive number of treatment options, and is a complaint that carries with it a high risk of complications and  morbidity.  The differential diagnosis includes ***   Co morbidities that complicate the patient evaluation  See HPI   Additional history obtained:  Additional history obtained from EMR External records from outside source obtained and reviewed including ***   Lab Tests:  I Ordered, and personally interpreted labs.  The pertinent results include:  ***   Imaging Studies ordered:  I ordered imaging studies including ***  I independently visualized and interpreted imaging which showed *** I agree with the radiologist interpretation   Cardiac Monitoring: / EKG:  The patient was maintained on a cardiac monitor.  I personally viewed and interpreted the cardiac monitored which showed an underlying rhythm of: ***   Consultations Obtained:  I requested consultation with the ***,  and discussed lab and imaging findings as well as pertinent plan - they recommend: ***   Problem List / ED Course / Critical interventions / Medication management  *** I ordered medication including ***  for ***  Reevaluation of the patient after these medicines showed that the patient {resolved/improved/worsened:23923::"improved"} I have reviewed the patients home medicines and have made adjustments as needed   Social Determinants of Health:  ***   Test / Admission - Considered:  Vitals signs significant for ***. Otherwise within normal range and stable throughout visit. Laboratory/imaging studies significant for: *** *** Worrisome signs and symptoms were discussed with the patient, and the patient acknowledged understanding to return to the ED if noticed. Patient was stable upon discharge.    {Document critical care time when appropriate:1} {Document review of labs and clinical decision tools ie heart score, Chads2Vasc2 etc:1}  {Document your independent review of radiology images, and any outside records:1} {Document your discussion with family members, caretakers, and with  consultants:1} {Document social determinants of health affecting pt's care:1} {Document your decision making why or why not admission, treatments were needed:1} Final Clinical Impression(s) / ED Diagnoses Final diagnoses:  None    Rx / DC Orders ED Discharge Orders     None

## 2022-08-08 NOTE — ED Triage Notes (Signed)
Patient here POV from Home.  Endorses CP that began Sunday. Mostly Mid and radiates to Lower Left Chest. Intermittent and Aches in [place].   No SOB. No N/V.   NAD Noted during Triage. A&Ox4. GCS 15. Ambulatory.

## 2022-08-08 NOTE — ED Provider Notes (Signed)
[facility] Provider Note   CSN: [id number] Arrival date & time: 08/08/22  1114     History  Chief Complaint  Patient presents with   Chest Pain    Carol Kerr is a 67 y.o. female.   Chest Pain   67 year old female presents emergency department with complaints of chest pain.  Patient states that chest pain began this past Sunday after awakening.  Reports pain initially in epigastric region without radiation.  States she took a Pepcid due to concerns of it being "heartburn" of which helped some.  She notes that subsequently has had episodes at night in the morning described as left-sided chest pain.  Denies fever, cough, congestion, shortness of breath, abdominal pain, nausea, vomiting, urinary symptoms, change in bowel habits.  Denies any history of cardiac or pulmonary complications.  Denies history of DVT/PE, recent surgery/immobilization, known malignancy, coagulopathy.  Past medical history significant for hyperlipidemia  Home Medications Prior to Admission medications   Medication Sig Start Date End Date Taking? Authorizing Provider  pantoprazole (PROTONIX) 20 MG tablet Take 1 tablet (20 mg total) by mouth daily. 08/08/22  Yes Dion Saucier A, PA  ezetimibe (ZETIA) 10 MG tablet Take 10 mg by mouth daily. 08/07/22   [provider]  Multiple Vitamin (MULTIVITAMIN) tablet Take 1 tablet by mouth daily.    [provider]  triamcinolone cream (KENALOG) 0.1 % Apply 1 application topically daily.    [provider]      Allergies    Statins and Xylocaine [lidocaine hcl]    Review of Systems   Review of Systems  Cardiovascular:  Positive for chest pain.  All other systems reviewed and are negative.   Physical Exam Updated Vital Signs BP 125/74   Pulse (!) 59   Temp 98.1 F (36.7 C) (Oral)   Resp 12   Ht 5' 7"$  (1.702 m)   Wt 63.5 kg   SpO2 98%   BMI 21.93 kg/m  Physical Exam Vitals and  nursing note reviewed.  Constitutional:      General: She is not in acute distress.    Appearance: She is well-developed.  HENT:     Head: Normocephalic and atraumatic.  Eyes:     Conjunctiva/sclera: Conjunctivae normal.  Cardiovascular:     Rate and Rhythm: Normal rate and regular rhythm.     Heart sounds: No murmur heard. Pulmonary:     Effort: Pulmonary effort is normal. No respiratory distress.     Breath sounds: Normal breath sounds. No wheezing, rhonchi or rales.  Abdominal:     Palpations: Abdomen is soft.     Tenderness: There is no abdominal tenderness. There is no right CVA tenderness or left CVA tenderness.  Musculoskeletal:        General: No swelling.     Cervical back: Neck supple.     Right lower leg: No edema.     Left lower leg: No edema.  Skin:    General: Skin is warm and dry.     Capillary Refill: Capillary refill takes less than 2 seconds.  Neurological:     Mental Status: She is alert.  Psychiatric:        Mood and Affect: Mood normal.     ED Results / Procedures / Treatments   Labs (all labs ordered are listed, but only abnormal results are displayed) Labs Reviewed  BASIC METABOLIC PANEL  CBC  D-DIMER, QUANTITATIVE  TROPONIN I (HIGH SENSITIVITY)  TROPONIN I (HIGH SENSITIVITY)    EKG EKG Interpretation  Date/Time:  Tuesday August 08 2022 11:20:49 EST Ventricular Rate:  77 PR Interval:  148 QRS Duration: 104 QT Interval:  398 QTC Calculation: 451 R Axis:   42 Text Interpretation: Sinus rhythm Confirmed by Regan Lemming ([id number]) on 08/08/2022 11:38:47 AM  Radiology DG Chest Portable 1 View  Result Date: 08/08/2022 CLINICAL DATA:  CP EXAM: PORTABLE CHEST - 1 VIEW COMPARISON:  11/10/2014 FINDINGS: Cardiac silhouette is unremarkable. No pneumothorax or pleural effusion. The lungs are clear. Aorta is calcified. There are thoracic degenerative changes. IMPRESSION: No acute cardiopulmonary process. Electronically Signed   By: Sammie Bench  M.D.   On: 08/08/2022 11:37    Procedures Procedures    Medications Ordered in ED Medications  alum & mag hydroxide-simeth (MAALOX/MYLANTA) 200-200-20 MG/5ML suspension 15 mL (15 mLs Oral Given 08/08/22 1206)  famotidine (PEPCID) tablet 20 mg (20 mg Oral Given 08/08/22 1206)    ED Course/ Medical Decision Making/ A&P                            Medical Decision Making Amount and/or Complexity of Data Reviewed Labs: ordered. Radiology: ordered.  Risk OTC drugs.   This patient presents to the ED for concern of chest pain, this involves an extensive number of treatment options, and is a complaint that carries with it a high risk of complications and morbidity.  The differential diagnosis includes ACS, PE, pneumothorax, pneumonia, peritonitis/myocarditis/tamponade, aortic dissection, aortic aneurysm  Co morbidities that complicate the patient evaluation  See HPI   Additional history obtained:  Additional history obtained from EMR External records from outside source obtained and reviewed including hospital records   Lab Tests:  I Ordered, and personally interpreted labs.  The pertinent results include: No leukocytosis noted.  No evidence of anemia.  Platelets within normal range.  No electrolyte abnormalities appreciated.  No renal dysfunction.  D-dimer within normal limits.  Initial troponin of 2 with repeat 3; EKG without acute ischemic changes.  Doubt ACS.   Imaging Studies ordered:  I ordered imaging studies including chest x-ray I independently visualized and interpreted imaging which showed no acute cardiopulmonary abnormalities I agree with the radiologist interpretation   Cardiac Monitoring: / EKG:  The patient was maintained on a cardiac monitor.  I personally viewed and interpreted the cardiac monitored which showed an underlying rhythm of: Sinus rhythm without acute ischemic changes   Consultations Obtained:  N/a   Problem List / ED Course / Critical  interventions / Medication management  Chest pain I ordered medication including Maalox, Pepcid   Reevaluation of the patient after these medicines showed that the patient improved I have reviewed the patients home medicines and have made adjustments as needed   Social Determinants of Health:  Former cigarette use.  Denies illicit drug use.   Test / Admission - Considered:  Chest pain Vitals signs within normal range and stable throughout visit. Laboratory/imaging studies significant for: See above Patient with evidence of chest pain.  Doubt ACS given delta negative troponin and lack of EKG changes; heart score 0-3.  Doubt PE given negative D-dimer without pleuritic chest pain.  Doubt pneumothorax, tamponade, pericarditis/myocarditis.  Patient noted some improvement with administration of Pepcid at home l so we will send home PPI to take daily; patient without chest pain while emergency department.  Ambulatory referral sent in for cardiology for repeat assessment.  Treatment plan discussed at  length with patient and she acknowledged understanding was agreeable to said plan. Worrisome signs and symptoms were discussed with the patient, and the patient acknowledged understanding to return to the ED if noticed. Patient was stable upon discharge.          Final Clinical Impression(s) / ED Diagnoses Final diagnoses:  Chest pain, unspecified type    Rx / DC Orders ED Discharge Orders          Ordered    Ambulatory referral to Cardiology       Comments: If you have not heard from the Cardiology office within the next 72 hours please call [phone].   08/08/22 1403    pantoprazole (PROTONIX) 20 MG tablet  Daily        08/08/22 1403              Wilnette Kales, [place] 08/08/22 1521    Regan Lemming, MD 08/08/22 1911

## 2022-08-08 NOTE — ED Notes (Signed)
RN provided AVS using Teachback Method. Patient verbalizes understanding of Discharge Instructions. Opportunity for Questioning and Answers were provided by RN. Patient Discharged from ED ambulatory to home via Self.

## 2022-08-08 NOTE — Discharge Instructions (Addendum)
Note the workup today was overall reassuring.  Very low suspicion for blood clot in the lung, pneumonia, collapsed lung, heart attack.  Recommend continuation of reflux medication in the form of pantoprazole as well as sending in cardiology referral for close follow-up.  Please do not hesitate to return to emergency department for worrisome signs and symptoms we discussed become apparent.

## 2022-08-16 LAB — LAB REPORT - SCANNED: EGFR: 78

## 2022-09-04 ENCOUNTER — Other Ambulatory Visit: Payer: Self-pay | Admitting: Family Medicine

## 2022-09-04 ENCOUNTER — Other Ambulatory Visit (HOSPITAL_BASED_OUTPATIENT_CLINIC_OR_DEPARTMENT_OTHER): Payer: Self-pay | Admitting: Family Medicine

## 2022-09-04 DIAGNOSIS — E785 Hyperlipidemia, unspecified: Secondary | ICD-10-CM

## 2022-09-04 DIAGNOSIS — E2839 Other primary ovarian failure: Secondary | ICD-10-CM

## 2022-09-06 ENCOUNTER — Ambulatory Visit: Payer: Managed Care, Other (non HMO) | Attending: Cardiology | Admitting: Cardiology

## 2022-09-06 VITALS — BP 117/65 | HR 62 | Ht 67.0 in | Wt 140.0 lb

## 2022-09-06 DIAGNOSIS — I10 Essential (primary) hypertension: Secondary | ICD-10-CM | POA: Diagnosis not present

## 2022-09-06 DIAGNOSIS — R072 Precordial pain: Secondary | ICD-10-CM

## 2022-09-06 LAB — BASIC METABOLIC PANEL
BUN/Creatinine Ratio: 20 (ref 12–28)
BUN: 16 mg/dL (ref 8–27)
CO2: 27 mmol/L (ref 20–29)
Calcium: 9.6 mg/dL (ref 8.7–10.3)
Chloride: 103 mmol/L (ref 96–106)
Creatinine, Ser: 0.79 mg/dL (ref 0.57–1.00)
Glucose: 87 mg/dL (ref 70–99)
Potassium: 4.5 mmol/L (ref 3.5–5.2)
Sodium: 141 mmol/L (ref 134–144)
eGFR: 83 mL/min/{1.73_m2} (ref >59)

## 2022-09-06 MED ORDER — METOPROLOL TARTRATE 25 MG PO TABS: 25.0000 mg | ORAL_TABLET | Freq: Once | ORAL | 0 refills | Status: DC

## 2022-09-06 NOTE — Progress Notes (Signed)
Cardiology Office Note:    Date:  09/06/2022   ID:  Carol Kerr, DOB [birth year], MRN [id number]  PCP:  Kathyrn Lass   [facility] Providers Cardiologist:  Candee Furbish, MD     Referring MD: Wilnette Kales, PA    History of Present Illness:    Carol Kerr is a 67 y.o. female here for evaluation of chest pain.  She was previously seen here in 2016 by Dr. Fransico Him.  She was in the emergency department on 08/08/2022.  EKG showed sinus rhythm 77 no other abnormalities.  She had chest pain that she noted on a Sunday after awakening.  Epigastric without any radiation.  Thought it may have been heartburn took a Pepcid and maybe this helped.  She had some episodes subsequently at night and in the morning left-sided.  Had some radiation down her arm.  No shortness of breath.  No PEs in the past.  Mother MI age 29.  Died.  Elevated cholesterol. Brother MI 39.  Husband works with [employer].   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  At 1 point she went to the school nurse her blood pressure was elevated 140/88 felt some shortness chest pain hard to take a deep breath then diaphoretic nausea to PCP but did not have any further chest discomfort at that time.  Previously running.  Underwent stress Myoview that was reassuring in 2016.  Occasional back pain.  Troponin was normal in the emergency room, EKG personally reviewed and interpreted showed no ischemic changes.  ER notes reviewed.  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   metoprolol tartrate (LOPRESSOR) 25 MG tablet Take 1 tablet (25 mg total) by mouth once for 1 dose.   Multiple Vitamin (MULTIVITAMIN) tablet Take 1 tablet by mouth daily.     Allergies:   Statins and Xylocaine [lidocaine hcl]   Social History   Socioeconomic History   Marital status: Married    Spouse name: Not on file   Number of children: Not on file   Years of education: Not on file   Highest education level: Not on file  Occupational History   Not on file  Tobacco Use   Smoking status: Former    Types: Cigarettes    Quit date: 06/27/1971    Years since quitting: 51.2   Smokeless tobacco: Not on file  Substance and Sexual Activity   Alcohol use: Yes    Comment: 4 OZ WINE, 3-4 TIMES A WEEK   Drug use: Not Currently   Sexual activity: Not on file  Other Topics Concern   Not on file  Social History Narrative   Not on file  Social Determinants of Health   Financial Resource Strain: Not on file  Food Insecurity: Not on file  Transportation Needs: Not on file  Physical Activity: Not on file  Stress: Not on file  Social Connections: Not on file     Family History: The patient's family history includes Cancer in her father and maternal grandmother; Heart attack in her brother and mother; Hypertension in her mother. There is no history of Breast cancer.  ROS:   Please see the history of present illness.    No fevers chills nausea vomiting syncope bleeding all other systems reviewed and are negative.  EKGs/Labs/Other Studies Reviewed:    The following studies were reviewed today: Cardiac Studies & Procedures     STRESS TESTS  EXERCISE TOLERANCE TEST (ETT) 02/18/2015  Narrative  There was no ST segment deviation noted during stress.   ECHOCARDIOGRAM  ECHOCARDIOGRAM COMPLETE  01/25/2015  Narrative *[facility]* [address], [place] [address] [phone]  ------------------------------------------------------------------- Transthoracic Echocardiography  Patient:    Carol Kerr, Carol Kerr MR #:       [id number] Study Date: 01/25/2015 Gender:     F Age:        56 Height:     170.2 cm Weight:     61.7 kg BSA:        1.71 m^2 Pt. Status: Room:  ATTENDING    Fransico Him, MD ORDERING     Fransico Him, MD REFERRING    Fransico Him, MD SONOGRAPHER  Oletta Lamas, Will PERFORMING   [facility], Outpatient  cc:  ------------------------------------------------------------------- LV EF: 55% -   60%  ------------------------------------------------------------------- Indications:      (I34.1).  ------------------------------------------------------------------- History:   PMH:  History of MVP (?). Bradycardia. Palpitations. Acquired from the patient and from the patient&'s chart.  Chest pain.  ------------------------------------------------------------------- Study Conclusions  - Left ventricle: The cavity size was normal. Wall thickness was normal. Systolic function was normal. The estimated ejection fraction was in the range of 55% to 60%. Wall motion was normal; there were no regional wall motion abnormalities.  Transthoracic echocardiography.  M-mode, complete 2D, spectral Doppler, and color Doppler.  Birthdate:  Patient birthdate: 12-Dec-1955.  Age:  Patient is 67 yr old.  Sex:  Gender: female. BMI: 21.3 kg/m^2.  Blood pressure:     120/70  Patient status: Outpatient.  Study date:  Study date: 01/25/2015. Study time: 07:46 AM.  Location:  [facility]  -------------------------------------------------------------------  ------------------------------------------------------------------- Left ventricle:  The cavity size was normal. Wall thickness was normal. Systolic function was normal. The estimated ejection fraction was in the range  of 55% to 60%. Wall motion was normal; there were no regional wall motion abnormalities.  ------------------------------------------------------------------- Aortic valve:   Structurally normal valve.   Cusp separation was normal.  Doppler:  Transvalvular velocity was within the normal range. There was no stenosis. There was no regurgitation.    VTI ratio of LVOT to aortic valve: 0.73. Valve area (VTI): 1.28 cm^2. Indexed valve area (VTI): 0.75 cm^2/m^2. Peak velocity ratio of LVOT to aortic valve: 0.8. Valve area (Vmax): 1.42 cm^2. Indexed valve area (Vmax): 0.83 cm^2/m^2. Mean velocity ratio of LVOT to aortic valve: 0.77. Valve area (Vmean): 1.36 cm^2. Indexed valve area (Vmean): 0.8 cm^2/m^2.    Mean gradient (S): 4 mm Hg. Peak gradient (S): 8 mm Hg.  ------------------------------------------------------------------- Aorta:  Aortic root: The aortic root was normal in size. Ascending aorta: The ascending aorta was normal in size.  ------------------------------------------------------------------- Mitral valve:  Structurally normal valve.   Leaflet separation was normal.  Doppler:  Transvalvular velocity was within the normal range. There was no evidence for stenosis. There was trivial regurgitation.    Peak gradient (D): 2 mm Hg.  ------------------------------------------------------------------- Left atrium:  The atrium was normal in size.  ------------------------------------------------------------------- Right ventricle:  The cavity size was normal. Systolic function was normal.  ------------------------------------------------------------------- Pulmonic valve:    The valve appears to be grossly normal. Doppler:  There was trivial regurgitation.  ------------------------------------------------------------------- Tricuspid valve:   Structurally normal valve.   Leaflet separation was normal.  Doppler:  Transvalvular velocity was within the normal range. There was  trivial regurgitation.  ------------------------------------------------------------------- Pulmonary artery:   Systolic pressure was within the normal range.  ------------------------------------------------------------------- Right atrium:  The atrium was normal in size.  ------------------------------------------------------------------- Pericardium:  There was no pericardial effusion.  ------------------------------------------------------------------- Measurements  Left ventricle                            Value          Reference LV ID, ED, PLAX chordal                   45.2  mm       43 - 52 LV ID, ES, PLAX chordal                   29.1  mm       23 - 38 LV fx shortening, PLAX chordal            36    %        >=29 LV PW thickness, ED                       9.16  mm       --------- IVS/LV PW ratio, ED                       0.99           <=1.3 Stroke volume, 2D                         50    ml       --------- Stroke volume/bsa, 2D                     29    ml/m^2   --------- LV e&', lateral                            8.58  cm/s     --------- LV E/e&', lateral                          8.57           --------- LV e&', medial                             6.63  cm/s     --------- LV E/e&', medial                           11.09          --------- LV e&', average  7.61  cm/s     --------- LV E/e&', average                          9.66           ---------  Ventricular septum                        Value          Reference IVS thickness, ED                         9.05  mm       ---------  LVOT                                      Value          Reference LVOT ID, S                                15    mm       --------- LVOT area                                 1.77  cm^2     --------- LVOT ID                                   15    mm       --------- LVOT peak velocity, S                     114   cm/s     --------- LVOT mean velocity, S                      74.4  cm/s     --------- LVOT VTI, S                               28    cm       --------- LVOT peak gradient, S                     5     mm Hg    --------- Stroke volume (SV), LVOT DP               49.5  ml       --------- Stroke index (SV/bsa), LVOT DP            29    ml/m^2   ---------  Aortic valve                              Value          Reference Aortic valve peak velocity, S             142   cm/s     --------- Aortic valve mean velocity, S             97    cm/s     ---------  Aortic valve VTI, S                       38.6  cm       --------- Aortic mean gradient, S                   4     mm Hg    --------- Aortic peak gradient, S                   8     mm Hg    --------- VTI ratio, LVOT/AV                        0.73           --------- Aortic valve area, VTI                    1.28  cm^2     --------- Aortic valve area/bsa, VTI                0.75  cm^2/m^2 --------- Velocity ratio, peak, LVOT/AV             0.8            --------- Aortic valve area, peak velocity          1.42  cm^2     --------- Aortic valve area/bsa, peak               0.83  cm^2/m^2 --------- velocity Velocity ratio, mean, LVOT/AV             0.77           --------- Aortic valve area, mean velocity          1.36  cm^2     --------- Aortic valve area/bsa, mean               0.8   cm^2/m^2 --------- velocity  Aorta                                     Value          Reference Aortic root ID, ED                        32    mm       --------- Ascending aorta ID, A-P, S                34    mm       ---------  Left atrium                               Value          Reference LA ID, A-P, ES                            31    mm       --------- LA ID/bsa, A-P                            1.82  cm/m^2   <=2.2 LA volume, S  44    ml       --------- LA volume/bsa, S                          25.8  ml/m^2   --------- LA volume, ES, 1-p A4C                    43    ml        --------- LA volume/bsa, ES, 1-p A4C                25.2  ml/m^2   --------- LA volume, ES, 1-p A2C                    42    ml       --------- LA volume/bsa, ES, 1-p A2C                24.6  ml/m^2   ---------  Mitral valve                              Value          Reference Mitral E-wave peak velocity               73.5  cm/s     --------- Mitral A-wave peak velocity               63.2  cm/s     --------- Mitral deceleration time                  218   ms       150 - 230 Mitral peak gradient, D                   2     mm Hg    --------- Mitral E/A ratio, peak                    1.2            ---------  Pulmonary arteries                        Value          Reference PA pressure, S, DP                        21    mm Hg    <=30  Tricuspid valve                           Value          Reference Tricuspid regurg peak velocity            210   cm/s     --------- Tricuspid peak RV-RA gradient             18    mm Hg    ---------  Systemic veins                            Value          Reference Estimated CVP  3     mm Hg    ---------  Right ventricle                           Value          Reference RV pressure, S, DP                        21    mm Hg    <=30 RV s&', lateral, S                         11.1  cm/s     ---------  Legend: (L)  and  (H)  Addilynn Mowrer values outside specified reference range.  ------------------------------------------------------------------- Prepared and Electronically Authenticated by  Mertie Moores, M.D. 2016-08-01T09:34:55    MONITORS  CARDIAC EVENT MONITOR 03/03/2015  Narrative  Normal sinus rhythm with average heart rate 65 bpm  Occasional PVC's            EKG: 08/08/2022: Sinus rhythm 70s no other abnormalities  Recent Labs: 08/08/2022: BUN 14; Creatinine, Ser 0.84; Hemoglobin 14.3; Platelets 268; Potassium 3.9; Sodium 141  Recent Lipid Panel    Component Value Date/Time   CHOL 222 (H) 01/25/2015 0733    TRIG 65 01/25/2015 0733   HDL 74 01/25/2015 0733   LDLCALC 135 (H) 01/25/2015 0733     Risk Assessment/Calculations:              Physical Exam:    VS:  BP 117/65 (BP Location: Left Arm, Patient Position: Sitting, Cuff Size: Normal)   Pulse 62   Ht '5\' 7"'$  (1.702 m)   Wt 140 lb (63.5 kg)   SpO2 98%   BMI 21.93 kg/m     Wt Readings from Last 3 Encounters:  09/06/22 140 lb (63.5 kg)  08/08/22 140 lb (63.5 kg)  01/20/15 136 lb 3.2 oz (61.8 kg)     GEN:  Well nourished, well developed in no acute distress HEENT: Normal NECK: No JVD; No carotid bruits LYMPHATICS: No lymphadenopathy CARDIAC: RRR, no murmurs, rubs, gallops RESPIRATORY:  Clear to auscultation without rales, wheezing or rhonchi  ABDOMEN: Soft, non-tender, non-distended MUSCULOSKELETAL:  No edema; No deformity  SKIN: Warm and dry NEUROLOGIC:  Alert and oriented x 3 PSYCHIATRIC:  Normal affect   ASSESSMENT:    1. Precordial pain   2. Hypertension, unspecified type    PLAN:    In order of problems listed above:  Precordial chest pain Family history of CAD - We will go ahead and check a coronary CT scan with possible FFR analysis.  Will make sure that she does not have any evidence of significant plaque either calcified or noncalcified/stenosis.  We will let her know the results of study.  She has a strong family history with her brother having an MI at age 101 mother dying of MI at age 64.  Her mother had very high cholesterol. -Differential does include musculoskeletal or perhaps GERD.  Hyperlipidemia - LDL calculated 136 in 2016.  121 in February 2024.  Creatinine 0.83 on 08/16/2022.  Currently on Zetia 10 mg.  Has had intolerance in the past to statins.  Palpitations - Has not experienced these in quite some time.  Previously diagnosed with PVCs.  Had trivial mitral regurgitation on 2016 echocardiogram.          Medication Adjustments/Labs and Tests Ordered: Current medicines are  reviewed at  length with the patient today.  Concerns regarding medicines are outlined above.  Orders Placed This Encounter  Procedures   CT CORONARY MORPH W/CTA COR W/SCORE W/CA W/CM &/OR WO/CM   Basic metabolic panel   Meds ordered this encounter  Medications   metoprolol tartrate (LOPRESSOR) 25 MG tablet    Sig: Take 1 tablet (25 mg total) by mouth once for 1 dose.    Dispense:  1 tablet    Refill:  0    Patient Instructions  Medication Instructions:  Your physician recommends that you continue on your current medications as directed. Please refer to the Current Medication list given to you today.  *If you need a refill on your cardiac medications before your next appointment, please call your pharmacy*   Lab Work: BMET If you have labs (blood work) drawn today and your tests are completely normal, you will receive your results only by: [place] (if you have MyChart) OR A paper copy in the mail If you have any lab test that is abnormal or we need to change your treatment, we will call you to review the results.   Testing/Procedures: Your physician has requested that you have cardiac CT. Cardiac computed tomography (CT) is a painless test that uses an x-ray machine to take clear, detailed pictures of your heart. For further information please visit [facility]iest[facility][email or web address]. Please follow instruction sheet as given.     Follow-Up: At [facility], you and your health needs are our priority.  As part of our continuing mission to provide you with exceptional heart care, we have created designated Provider Care Teams.  These Care Teams include your primary Cardiologist (physician) and Advanced Practice Providers (APPs -  Physician Assistants and Nurse Practitioners) who all work together to provide you with the care you need, when you need it.     Your next appointment:   1 year(s)  Provider:   Candee Furbish, MD     Other Instructions    [facility] [place], [place] [address] [phone]   Please arrive at the [facility] and Children's Entrance (Entrance C2) of [facility] 30 minutes prior to test start time. You can use the FREE valet parking offered at entrance C (encouraged to control the heart rate for the test)  Proceed to the [facility] Radiology Department (first floor) to check-in and test prep.  All radiology patients and guests should use entrance C2 at [facility], accessed from [facility], even though the hospital's physical address listed is [address].     Please follow these instructions carefully (unless otherwise directed):   On the Night Before the Test: Be sure to Drink plenty of water. Do not consume any caffeinated/decaffeinated beverages or chocolate 12 hours prior to your test. Do not take any antihistamines 12 hours prior to your test.  On the Day of the Test: Drink plenty of water until 1 hour prior to the test. Do not eat any food 1 hour prior to test. You may take your regular medications prior to the test.  Take metoprolol (Lopressor) two hours prior to test. FEMALES- please wear underwire-free bra if available, avoid dresses & tight clothing        After the Test: Drink plenty of water. After receiving IV contrast, you may experience a mild flushed feeling. This is normal. On occasion, you may experience a mild rash up to 24 hours after the test.  This is not dangerous. If this occurs, you can take Benadryl 25 mg and increase your fluid intake. If you experience trouble breathing, this can be serious. If it is severe call 911 IMMEDIATELY. If it is mild, please call our office. If you take any of these medications: Glipizide/Metformin, Avandament, Glucavance, please do not take 48 hours after completing test unless otherwise instructed.  We will call to schedule your test 2-4 weeks out understanding that some insurance companies will need an  authorization prior to the service being performed.   For non-scheduling related questions, please contact the cardiac imaging nurse navigator should you have any questions/concerns: Marchia Bond, Cardiac Imaging Nurse Navigator Gordy Clement, Cardiac Imaging Nurse Navigator [facility] Direct Office Dial: [phone]   For scheduling needs, including cancellations and rescheduling, please call [place], [phone].    Signed, Candee Furbish, MD  09/06/2022 11:06 AM    [place]

## 2022-09-06 NOTE — Patient Instructions (Addendum)
Medication Instructions:  Your physician recommends that you continue on your current medications as directed. Please refer to the Current Medication list given to you today.  *If you need a refill on your cardiac medications before your next appointment, please call your pharmacy*   Lab Work: BMET If you have labs (blood work) drawn today and your tests are completely normal, you will receive your results only by: [place] (if you have MyChart) OR A paper copy in the mail If you have any lab test that is abnormal or we need to change your treatment, we will call you to review the results.   Testing/Procedures: Your physician has requested that you have cardiac CT. Cardiac computed tomography (CT) is a painless test that uses an x-ray machine to take clear, detailed pictures of your heart. For further information please visit [facility]tn. Please follow instruction sheet as given.     Follow-Up: At [facility], you and your health needs are our priority.  As part of our continuing mission to provide you with exceptional heart care, we have created designated Provider Care Teams.  These Care Teams include your primary Cardiologist (physician) and Advanced Practice Providers (APPs -  Physician Assistants and Nurse Practitioners) who all work together to provide you with the care you need, when you need it.     Your next appointment:   1 year(s)  Provider:   Candee Furbish, MD     Other Instructions    [facility] [place], [place] [address] [phone]   Please arrive at the [facility] and Children's Entrance (Entrance C2) of [facility] 30 minutes prior to test start time. You can use the FREE valet parking offered at entrance C (encouraged to control the heart rate for the test)  Proceed to the [facility] Radiology Department (first floor) to check-in and test prep.  All radiology patients and guests should use  entrance C2 at [facility], accessed from [facility], even though the hospital's physical address listed is [address]     Please follow these instructions carefully (unless otherwise directed):   On the Night Before the Test: Be sure to Drink plenty of water. Do not consume any caffeinated/decaffeinated beverages or chocolate 12 hours prior to your test. Do not take any antihistamines 12 hours prior to your test.  On the Day of the Test: Drink plenty of water until 1 hour prior to the test. Do not eat any food 1 hour prior to test. You may take your regular medications prior to the test.  Take metoprolol (Lopressor) two hours prior to test. FEMALES- please wear underwire-free bra if available, avoid dresses & tight clothing        After the Test: Drink plenty of water. After receiving IV contrast, you may experience a mild flushed feeling. This is normal. On occasion, you may experience a mild rash up to 24 hours after the test. This is not dangerous. If this occurs, you can take Benadryl 25 mg and increase your fluid intake. If you experience trouble breathing, this can be serious. If it is severe call 911 IMMEDIATELY. If it is mild, please call our office. If you take any of these medications: Glipizide/Metformin, Avandament, Glucavance, please do not take 48 hours after completing test unless otherwise instructed.  We will call to schedule your test 2-4 weeks out understanding that some insurance companies will need an authorization prior to the service being performed.  For non-scheduling related questions, please contact the cardiac imaging nurse navigator should you have any questions/concerns: Marchia Bond, Cardiac Imaging Nurse Navigator Gordy Clement, Cardiac Imaging Nurse Navigator [facility] Direct Office Dial: [phone]   For scheduling needs, including cancellations and rescheduling, please call  [place], [phone].

## 2022-09-08 ENCOUNTER — Other Ambulatory Visit: Payer: Self-pay | Admitting: Family Medicine

## 2022-09-08 DIAGNOSIS — Z1231 Encounter for screening mammogram for malignant neoplasm of breast: Secondary | ICD-10-CM

## 2022-09-15 ENCOUNTER — Telehealth (HOSPITAL_COMMUNITY): Payer: Self-pay | Admitting: Emergency Medicine

## 2022-09-15 NOTE — Telephone Encounter (Signed)
Attempted to call patient regarding upcoming cardiac CT appointment. °Left message on voicemail with name and callback number °Isaly Fasching RN Navigator Cardiac Imaging °[facility] °[phone] Office °[phone] Cell ° °

## 2022-09-15 NOTE — Telephone Encounter (Signed)
Reaching out to patient to offer assistance regarding upcoming cardiac imaging study; pt verbalizes understanding of appt date/time, parking situation and where to check in, pre-test NPO status and medications ordered, and verified current allergies; name and call back number provided for further questions should they arise Carol Bond RN Navigator Cardiac Imaging [facility] [phone] office [phone] cell  Arival 1100 WC entrance Denies iv issues 25mg  metoprolol tartrate Aware contrast/nitro

## 2022-09-18 ENCOUNTER — Ambulatory Visit (HOSPITAL_COMMUNITY)
Admission: RE | Admit: 2022-09-18 | Discharge: 2022-09-18 | Disposition: A | Discharge status: Home / Self Care | Payer: Managed Care, Other (non HMO) | Source: Ambulatory Visit | Attending: Cardiology | Admitting: Cardiology

## 2022-09-18 ENCOUNTER — Ambulatory Visit (HOSPITAL_BASED_OUTPATIENT_CLINIC_OR_DEPARTMENT_OTHER)
Admission: RE | Admit: 2022-09-18 | Discharge: 2022-09-18 | Disposition: A | Discharge status: Home / Self Care | Payer: Managed Care, Other (non HMO) | Source: Ambulatory Visit | Attending: Cardiovascular Disease

## 2022-09-18 ENCOUNTER — Other Ambulatory Visit: Payer: Self-pay | Admitting: Cardiovascular Disease

## 2022-09-18 DIAGNOSIS — R931 Abnormal findings on diagnostic imaging of heart and coronary circulation: Secondary | ICD-10-CM

## 2022-09-18 DIAGNOSIS — R072 Precordial pain: Secondary | ICD-10-CM | POA: Diagnosis present

## 2022-09-18 DIAGNOSIS — I2510 Atherosclerotic heart disease of native coronary artery without angina pectoris: Secondary | ICD-10-CM

## 2022-09-18 MED ORDER — IOHEXOL 350 MG/ML SOLN
95.0000 mL | Freq: Once | INTRAVENOUS | Status: AC | PRN
  Administered 2022-09-18: 95 mL via INTRAVENOUS

## 2022-09-18 MED ORDER — NITROGLYCERIN 0.4 MG SL SUBL
SUBLINGUAL_TABLET | SUBLINGUAL | Status: AC
  Filled 2022-09-18: qty 2

## 2022-09-18 MED ORDER — NITROGLYCERIN 0.4 MG SL SUBL
0.8000 mg | SUBLINGUAL_TABLET | Freq: Once | SUBLINGUAL | Status: AC
  Administered 2022-09-18: 0.8 mg via SUBLINGUAL

## 2022-09-18 NOTE — Progress Notes (Signed)
FFR Ct ordered.   Carol Bells T. Audie Box, MD, [place]  [address] [place], [place] [address] [phone]  12:35 PM

## 2022-09-26 ENCOUNTER — Other Ambulatory Visit: Payer: Self-pay | Admitting: *Deleted

## 2022-09-26 ENCOUNTER — Other Ambulatory Visit (HOSPITAL_BASED_OUTPATIENT_CLINIC_OR_DEPARTMENT_OTHER): Payer: 59

## 2022-09-26 ENCOUNTER — Encounter (HOSPITAL_BASED_OUTPATIENT_CLINIC_OR_DEPARTMENT_OTHER): Payer: Self-pay

## 2022-09-26 DIAGNOSIS — Z789 Other specified health status: Secondary | ICD-10-CM

## 2022-09-26 DIAGNOSIS — E785 Hyperlipidemia, unspecified: Secondary | ICD-10-CM

## 2022-09-26 DIAGNOSIS — I2510 Atherosclerotic heart disease of native coronary artery without angina pectoris: Secondary | ICD-10-CM

## 2022-10-24 ENCOUNTER — Ambulatory Visit: Payer: Managed Care, Other (non HMO) | Attending: Cardiovascular Disease | Admitting: Student

## 2022-10-24 DIAGNOSIS — E785 Hyperlipidemia, unspecified: Secondary | ICD-10-CM

## 2022-10-24 NOTE — Progress Notes (Signed)
Patient ID: JAIA ALONGE                 DOB: 12-28-1955                    MRN: [id number]      HPI: Carol Kerr is a 66 y.o. female patient referred to lipid clinic by Dr.Skains. PMH is significant for CAD, (CAC -186),HDL, chest pain, family hx of premature ASCVD.   Patient presented today for lipid clinic. Patient went for CAC score test it is 186; which put patient in 44 th percentile for age, sex and race matched controls. Patient reports she has cholesterol problem for long time. Over years she had tried many different statins and they all caused sever muscle pain. She recalls trying Crestor, Lipitor and Zocor different doses. Crestor lead to CK elevation along with muscle pain. She has been taking Zetia for now 6-7 years and she has no issue from Zetia.Good at keeping her weight stable by watching diet and exercising regularly. Lately easing on exercise due to chronic back pain.   Diet: weight watchers -Low fat low carb diet, high fiber diet  Eat out once week and pick healthy option  Cookies are weakness but only eats one a week    Exercise: walking 4 miles  3-4 times per weeks, thinking to go back doing yoga   Family History:  mother MI at age 56,  Dad: stomach cancer died at 23  MGF: died from MI  at age of 79 MGM: colon cancer  PGM: stomach cancer  PGP: bone cancer brother MI at age 34  Social History:  Alcohol: red wine 4-5 drinks per week  Smoking: never  Recreational drug use: never   Labs: Lipid Panel     Component Value Date/Time   CHOL 222 (H) 01/25/2015 0733   TRIG 65 01/25/2015 0733   HDL 74 01/25/2015 0733   LDLCALC 135 (H) 01/25/2015 0733    Past Medical History:  Diagnosis Date   Chest pain    Concussion    FROM A FALL   Finger injury    FROM A FALL   Fracture of wrist    Hypercholesterolemia    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     diphenhydramine-acetaminophen (TYLENOL PM) 25-500 MG TABS tablet Take 1 tablet by mouth at bedtime as needed.     ezetimibe (ZETIA) 10 MG tablet Take 10 mg by mouth daily.     metoprolol tartrate (LOPRESSOR) 25 MG tablet Take 1 tablet (25 mg total) by mouth once for 1 dose. 1 tablet 0   Multiple Vitamin (MULTIVITAMIN) tablet Take 1 tablet by mouth daily.     No current facility-administered medications on file prior to visit.    Allergies  Allergen Reactions   Statins     MUSCLE PAINS   Xylocaine [Lidocaine Hcl]     NUMBNESS OF NECK    Assessment/Plan:  1. Hyperlipidemia -  No problems updated. No problem-specific Assessment & Plan notes found for this encounter.    Thank you,  Carmela Hurt, Pharm.D [facility] A Division of  [facility] [address], [place], [place] [address]  Phone: [phone]; Fax: [phone]

## 2022-10-24 NOTE — Assessment & Plan Note (Signed)
Assessment:  LDL goal: <70 mg/dl last LDLc  401 mg/dl ( 02/72/5366) Risk factors elevated Angina,CAC score 186, family hx of premature ASCVD (mother MI at age 66, brother MI at age 32) Tolerates Zetia  well without any side effects  Intolerance to statins - Crestor,Zocor, Lipitor - sever muscle pain and CK elevation from Crestor  Discussed next potential options (PCSK-9 inhibitors, bempedoic acid and inclisiran); cost, dosing efficacy, side effects  Patient is in agreement to initiate PCSK9i   Plan: Continue taking current medications Zetia 10 mg daily Will apply for PA for PCSK9i; will inform patient upon approval (prefers MyChart message) Lipid lab due in 2-3 months after starting [facility]

## 2022-10-24 NOTE — Patient Instructions (Signed)
Your Results:             Your most recent labs Goal  Total Cholesterol 200 < 200  Triglycerides 97 < 150  HDL (happy/good cholesterol) 62 > 40  LDL (lousy/bad cholesterol 121 < 70   Medication changes: We will start the process to get PCSK9i (Repatha or Praluent) covered by your insurance.  Once the prior authorization is complete, we will call you to let you know and confirm pharmacy information.     Praluent is a cholesterol medication that improved your body's ability to get rid of "bad cholesterol" known as LDL. It can lower your LDL up to 60%. It is an injection that is given under the skin every 2 weeks. The most common side effects of Praluent include runny nose, symptoms of the common cold, rarely flu or flu-like symptoms, back/muscle pain in about 3-4% of the patients, and redness, pain, or bruising at the injection site.    Repatha is a cholesterol medication that improved your body's ability to get rid of "bad cholesterol" known as LDL. It can lower your LDL up to 60%! It is an injection that is given under the skin every 2 weeks. The most common side effects of Repatha include runny nose, symptoms of the common cold, rarely flu or flu-like symptoms, back/muscle pain in about 3-4% of the patients, and redness, pain, or bruising at the injection site.   Lab orders: We want to repeat labs after 2-3 months.  We will send you a lab order to remind you once we get closer to that time.

## 2022-11-23 ENCOUNTER — Encounter: Payer: Self-pay | Admitting: Cardiology

## 2022-11-29 ENCOUNTER — Telehealth: Payer: Self-pay | Admitting: Pharmacist

## 2022-11-29 ENCOUNTER — Other Ambulatory Visit (HOSPITAL_COMMUNITY): Payer: Self-pay

## 2022-11-29 NOTE — Telephone Encounter (Signed)
PA initiated for PCSK9i 

## 2022-11-30 ENCOUNTER — Telehealth: Payer: Self-pay

## 2022-11-30 ENCOUNTER — Other Ambulatory Visit (HOSPITAL_COMMUNITY): Payer: Self-pay

## 2022-11-30 DIAGNOSIS — E785 Hyperlipidemia, unspecified: Secondary | ICD-10-CM

## 2022-11-30 NOTE — Telephone Encounter (Signed)
Pharmacy Patient Advocate Encounter   Received notification from [facility] that prior authorization for REPATHA140MG /ML is required/requested.   PA submitted to RX[employer]EF[employer]/PROMPTPA   Status is pending

## 2022-12-05 NOTE — Telephone Encounter (Signed)
Pharmacy Patient Advocate Encounter  Prior Authorization for REPATHA 140MG /ML  has been APPROVED by [employer]/PROMPT[employer]  from 6.9.24 to 12.9.24.

## 2022-12-06 MED ORDER — REPATHA SURECLICK 140 MG/ML ~~LOC~~ SOAJ: 140.0000 mg | SUBCUTANEOUS | 3 refills | Status: DC

## 2022-12-06 NOTE — Telephone Encounter (Signed)
PA approved see other encounter

## 2022-12-07 ENCOUNTER — Ambulatory Visit
Admission: RE | Admit: 2022-12-07 | Discharge: 2022-12-07 | Disposition: A | Discharge status: Home / Self Care | Payer: 59 | Source: Ambulatory Visit | Attending: Family Medicine | Admitting: Family Medicine

## 2022-12-07 DIAGNOSIS — E2839 Other primary ovarian failure: Secondary | ICD-10-CM

## 2023-01-29 NOTE — Telephone Encounter (Signed)
Called patient to remind for lipid lab, Repatha started mid June. Lab appointment scheduled for Aug 15 at [facility] [address] location.

## 2023-01-30 ENCOUNTER — Ambulatory Visit
Admission: RE | Admit: 2023-01-30 | Discharge: 2023-01-30 | Disposition: A | Discharge status: Home / Self Care | Payer: 59 | Source: Ambulatory Visit | Attending: Family Medicine | Admitting: Family Medicine

## 2023-01-30 DIAGNOSIS — Z1231 Encounter for screening mammogram for malignant neoplasm of breast: Secondary | ICD-10-CM

## 2023-02-05 ENCOUNTER — Telehealth: Payer: Self-pay | Admitting: Cardiology

## 2023-02-05 DIAGNOSIS — E785 Hyperlipidemia, unspecified: Secondary | ICD-10-CM

## 2023-02-05 NOTE — Telephone Encounter (Signed)
Patient is out of town but needs to have labs done. She will will go to [facility] [place], can it go ahead be release. Please advise

## 2023-02-05 NOTE — Telephone Encounter (Signed)
Spoke with patient, informed her Lipid panel order has been released to [facility]. She will go to [facility] sometime at the end of this week to have fasting lipid panel drawn.  Informed patient we will follow-up after labs are received and reviewed by provider.  Patient verbalized understanding and expressed appreciation for call.

## 2023-02-08 ENCOUNTER — Ambulatory Visit: Payer: 59

## 2023-08-08 ENCOUNTER — Telehealth: Payer: Self-pay

## 2023-08-08 ENCOUNTER — Other Ambulatory Visit (HOSPITAL_COMMUNITY): Payer: Self-pay

## 2023-08-08 NOTE — Telephone Encounter (Signed)
Pharmacy Patient Advocate Encounter   Received notification from CoverMyMeds that prior authorization for REPATHA is required/requested.   Insurance verification completed.   The patient is insured through [facility] .   Per test claim: PA required; PA submitted to above mentioned insurance via Prompt PA Key/confirmation #/[id number] Status is pending

## 2023-08-10 ENCOUNTER — Other Ambulatory Visit (HOSPITAL_COMMUNITY): Payer: Self-pay

## 2023-08-10 NOTE — Telephone Encounter (Signed)
Pharmacy Patient Advocate Encounter  Received notification from [facility] that Prior Authorization for REPATHA has been APPROVED from 08/10/23 to 02/07/24. Ran test claim, Copay is $0. This test claim was processed through [facility]- copay amounts may vary at other pharmacies due to pharmacy/plan contracts, or as the patient moves through the different stages of their insurance plan.

## 2023-10-26 ENCOUNTER — Emergency Department (HOSPITAL_BASED_OUTPATIENT_CLINIC_OR_DEPARTMENT_OTHER)
Admission: EM | Admit: 2023-10-26 | Discharge: 2023-10-26 | Disposition: A | Discharge status: Home / Self Care | Attending: Emergency Medicine | Admitting: Emergency Medicine

## 2023-10-26 ENCOUNTER — Emergency Department (HOSPITAL_BASED_OUTPATIENT_CLINIC_OR_DEPARTMENT_OTHER)

## 2023-10-26 ENCOUNTER — Other Ambulatory Visit: Payer: Self-pay

## 2023-10-26 ENCOUNTER — Encounter (HOSPITAL_BASED_OUTPATIENT_CLINIC_OR_DEPARTMENT_OTHER): Payer: Self-pay | Admitting: Emergency Medicine

## 2023-10-26 ENCOUNTER — Emergency Department (HOSPITAL_BASED_OUTPATIENT_CLINIC_OR_DEPARTMENT_OTHER): Admitting: Radiology

## 2023-10-26 DIAGNOSIS — S60511A Abrasion of right hand, initial encounter: Secondary | ICD-10-CM | POA: Diagnosis not present

## 2023-10-26 DIAGNOSIS — W172XXA Fall into hole, initial encounter: Secondary | ICD-10-CM | POA: Insufficient documentation

## 2023-10-26 DIAGNOSIS — W19XXXA Unspecified fall, initial encounter: Secondary | ICD-10-CM

## 2023-10-26 DIAGNOSIS — M79641 Pain in right hand: Secondary | ICD-10-CM

## 2023-10-26 DIAGNOSIS — M25562 Pain in left knee: Secondary | ICD-10-CM

## 2023-10-26 DIAGNOSIS — T148XXA Other injury of unspecified body region, initial encounter: Secondary | ICD-10-CM

## 2023-10-26 DIAGNOSIS — S80212A Abrasion, left knee, initial encounter: Secondary | ICD-10-CM | POA: Insufficient documentation

## 2023-10-26 MED ORDER — BACITRACIN ZINC 500 UNIT/GM EX OINT
TOPICAL_OINTMENT | Freq: Two times a day (BID) | CUTANEOUS | Status: DC
  Administered 2023-10-26: 31.5 via TOPICAL
  Filled 2023-10-26: qty 28.35

## 2023-10-26 NOTE — ED Provider Notes (Signed)
 [place] EMERGENCY DEPARTMENT AT [facility] Provider Note   CSN: [id number] Arrival date & time: 10/26/23  1950     History  Chief Complaint  Patient presents with   Carol Kerr is a 68 y.o. female here for evaluation mechanical fall.  Charge when she stepped into a hole in the ground subsequently falling on her right hand and left knee.  She is able to ambulate afterwards she has abrasion to her right palm and anterior knee.  She has pain to the base of her fifth metacarpal.  No numbness or weakness.  Right-hand-dominant.  She denies hitting her head, LOC or anticoagulation.  No back pain, chest pain, abd pain, numbness or weakness.   HPI     Home Medications Prior to Admission medications   Medication Sig Start Date End Date Taking? Authorizing Provider  BioGaia Probiotic (BIOGAIA/GERBER SOOTHE) LIQD Take 5 drops by mouth daily at 8 pm.    [provider]  calcium-vitamin D (OSCAL WITH D) 500-5 MG-MCG tablet Take 1 tablet by mouth.    [provider]  diphenhydramine-acetaminophen (TYLENOL PM) 25-500 MG TABS tablet Take 1 tablet by mouth at bedtime as needed.    [provider]  Evolocumab  (REPATHA  SURECLICK) 140 MG/ML SOAJ Inject 140 mg into the skin every 14 (fourteen) days. 12/06/22   Hugh Madura, MD  ezetimibe (ZETIA) 10 MG tablet Take 10 mg by mouth daily. 08/07/22   [provider]  Multiple Vitamin (MULTIVITAMIN) tablet Take 1 tablet by mouth daily.    [provider]      Allergies    Statins and Xylocaine [lidocaine hcl]    Review of Systems   Review of Systems  Constitutional: Negative.   HENT: Negative.    Respiratory: Negative.    Cardiovascular: Negative.   Gastrointestinal: Negative.   Genitourinary: Negative.   Musculoskeletal:        Left knee, right hand pain  Skin:  Positive for wound.  Neurological: Negative.   All other systems reviewed and are negative.   Physical  Exam Updated Vital Signs BP (!) 165/101 (BP Location: Left Arm)   Pulse 76   Temp 97.9 F (36.6 C) (Temporal)   Resp 16   SpO2 100%  Physical Exam Vitals and nursing note reviewed.  Constitutional:      General: She is not in acute distress.    Appearance: She is well-developed. She is not ill-appearing, toxic-appearing or diaphoretic.  HENT:     Head: Normocephalic and atraumatic.     Nose: Nose normal.     Mouth/Throat:     Mouth: Mucous membranes are moist.  Eyes:     Pupils: Pupils are equal, round, and reactive to light.  Cardiovascular:     Rate and Rhythm: Normal rate.     Pulses: Normal pulses.          Radial pulses are 2+ on the right side and 2+ on the left side.       Dorsalis pedis pulses are 2+ on the right side and 2+ on the left side.     Heart sounds: Normal heart sounds.  Pulmonary:     Effort: Pulmonary effort is normal. No respiratory distress.     Breath sounds: Normal breath sounds.  Abdominal:     General: Bowel sounds are normal. There is no distension.  Musculoskeletal:        General: Swelling, tenderness and signs of injury present.  Normal range of motion.       Hands:     Cervical back: Normal range of motion.     Left knee: Tenderness present. No medial joint line, lateral joint line or patellar tendon tenderness. Normal patellar mobility.       Legs:     Comments: Tenderness to left anterior knee with overlying abrasion, mild effusion.  Tenderness to right fifth metacarpal over right thenar eminence with abrasion nontender wrist, forearm.  Compartments soft  Skin:    General: Skin is warm and dry.     Comments: Abrasion, no lacerations to suture  Neurological:     General: No focal deficit present.     Mental Status: She is alert.     Sensory: Sensation is intact.     Motor: Motor function is intact.     Gait: Gait is intact.     Comments: Ambulatory Equal strength  Psychiatric:        Mood and Affect: Mood normal.     ED Results /  Procedures / Treatments   Labs (all labs ordered are listed, but only abnormal results are displayed) Labs Reviewed - No data to display  EKG None  Radiology DG Hand Complete Right Result Date: 10/26/2023 CLINICAL DATA:  Fall EXAM: RIGHT HAND - COMPLETE 3+ VIEW COMPARISON:  None Available. FINDINGS: There is no evidence of fracture or dislocation. There are mild to moderate degenerative changes of the first carpometacarpal joint and radiocarpal joint. There is calcification of the triangular fibrocartilage. Soft tissues are unremarkable. IMPRESSION: 1. No acute fracture or dislocation. 2. Degenerative changes as above. Electronically Signed   By: Tyron Gallon M.D.   On: 10/26/2023 22:17   DG Knee Complete 4 Views Left Result Date: 10/26/2023 CLINICAL DATA:  Patient stepped into a hole and fell. Left knee pain. EXAM: LEFT KNEE - COMPLETE 4+ VIEW COMPARISON:  None Available. FINDINGS: Degenerative changes in the left knee with medial compartment narrowing and cartilage calcification. No evidence of acute fracture or dislocation. No focal bone lesion or bone destruction. Bipartite patella. No significant effusions. Soft tissues are unremarkable. IMPRESSION: Mild medial compartment degenerative changes of the left knee. Bipartite patella. No acute bony abnormalities. Electronically Signed   By: Boyce Byes M.D.   On: 10/26/2023 21:30    Procedures .Ortho Injury Treatment  Date/Time: 10/26/2023 11:10 PM  Performed by: Coleta David A, PA-C Authorized by: Dickson Founds, PA-C   Consent:    Consent obtained:  Verbal   Consent given by:  Patient   Risks discussed:  Fracture, nerve damage, restricted joint movement, vascular damage, stiffness, recurrent dislocation and irreducible dislocation   Alternatives discussed:  No treatment, alternative treatment, immobilization, referral and delayed treatmentInjury location: knee Location details: left knee Injury type: soft  tissue Pre-procedure neurovascular assessment: neurovascularly intact Pre-procedure distal perfusion: normal Pre-procedure neurological function: normal  Anesthesia: Local anesthesia used: no  Patient sedated: NoImmobilization: brace Splint Applied by: ED Tech Post-procedure neurovascular assessment: post-procedure neurovascularly intact Post-procedure distal perfusion: normal Post-procedure neurological function: normal Post-procedure range of motion: normal       Medications Ordered in ED Medications  bacitracin  ointment (31.5 Applications Topical Given 10/26/23 2300)    ED Course/ Medical Decision Making/ A&P   67 year old here for evaluation mechanical fall.  She has abrasion pain and swelling to her left knee and right hand.  Neurovascularly intact.  No midline C/T/L tenderness.  Nontender wrist, forearm.  Plan on imaging  Imaging personally viewed interpreted Left  knee without acute fracture, dislocation Right hand without acute abnormality  Patient's wounds cleaned.  Will have her follow-up outpatient, return for any worsening symptoms.  She is placed in a knee sleeve for support on her left knee.  She cannot take NSAIDs due to colonoscopy in a few days.  Discussed Tylenol, ice and elevation.  The patient has been appropriately medically screened and/or stabilized in the ED. I have low suspicion for any other emergent medical condition which would require further screening, evaluation or treatment in the ED or require inpatient management.  Patient is hemodynamically stable and in no acute distress.  Patient able to ambulate in department prior to ED.  Evaluation does not show acute pathology that would require ongoing or additional emergent interventions while in the emergency department or further inpatient treatment.  I have discussed the diagnosis with the patient and answered all questions.  Pain is been managed while in the emergency department and patient has no  further complaints prior to discharge.  Patient is comfortable with plan discussed in room and is stable for discharge at this time.  I have discussed strict return precautions for returning to the emergency department.  Patient was encouraged to follow-up with PCP/specialist refer to at discharge.                                  Medical Decision Making Amount and/or Complexity of Data Reviewed Independent Historian: friend External Data Reviewed: labs, radiology and notes. Radiology: ordered and independent interpretation performed. Decision-making details documented in ED Course.  Risk OTC drugs. Prescription drug management. Decision regarding hospitalization. Diagnosis or treatment significantly limited by social determinants of health.          Final Clinical Impression(s) / ED Diagnoses Final diagnoses:  Fall, initial encounter  Abrasion  Acute pain of left knee  Right hand pain    Rx / DC Orders ED Discharge Orders     None         Rishit Burkhalter A, PA-C 10/26/23 2314    Horton, Sidra Dredge, DO 10/27/23 0018

## 2023-10-26 NOTE — ED Notes (Signed)
Reviewed discharge instructions and home care with pt. Pt verbalized understanding and had no further questions. Pt exited ED without complications.

## 2023-10-26 NOTE — ED Notes (Addendum)
 Cleaned scrapes on right hand and left knee with wound cleanser and bactrim ointment. Knee brace applied to left knee. Pt tolerated well.

## 2023-10-26 NOTE — ED Triage Notes (Signed)
 Pt was leaving church and stepped into a hole in the grass falling into the street on both knees and right palm.  Pain mostly localized to left knee.  Swelling and stiffness noted.  Did not hit head.

## 2023-10-26 NOTE — ED Notes (Signed)
 X-Ray at bedside.

## 2023-10-26 NOTE — Discharge Instructions (Signed)
 It was a pleasure taking care of you here today  Make sure to take Tylenol, ice and elevate the knee  Make sure to follow-up outpatient  If still having symptoms within a few days make sure to follow-up with orthopedics.  If you do not have 1 there is 1 listed in your discharge paperwork.  You will need to call to schedule appointment.

## 2023-11-03 ENCOUNTER — Other Ambulatory Visit: Payer: Self-pay | Admitting: Cardiology

## 2023-11-03 DIAGNOSIS — Z789 Other specified health status: Secondary | ICD-10-CM

## 2023-11-03 DIAGNOSIS — I2510 Atherosclerotic heart disease of native coronary artery without angina pectoris: Secondary | ICD-10-CM

## 2023-11-03 DIAGNOSIS — E785 Hyperlipidemia, unspecified: Secondary | ICD-10-CM

## 2023-11-16 ENCOUNTER — Encounter (HOSPITAL_BASED_OUTPATIENT_CLINIC_OR_DEPARTMENT_OTHER): Payer: Self-pay | Admitting: Family

## 2023-11-16 ENCOUNTER — Ambulatory Visit (HOSPITAL_BASED_OUTPATIENT_CLINIC_OR_DEPARTMENT_OTHER): Admitting: Family

## 2023-11-16 VITALS — BP 110/70 | HR 71 | Ht 67.0 in | Wt 145.2 lb

## 2023-11-16 DIAGNOSIS — I2510 Atherosclerotic heart disease of native coronary artery without angina pectoris: Secondary | ICD-10-CM | POA: Diagnosis not present

## 2023-11-16 DIAGNOSIS — E785 Hyperlipidemia, unspecified: Secondary | ICD-10-CM | POA: Diagnosis not present

## 2023-11-16 NOTE — Patient Instructions (Addendum)
 Medication Instructions:  Continue your current medications  *If you need a refill on your cardiac medications before your next appointment, please call your pharmacy*  Testing/Procedures: Your EKG today looked good!  Follow-Up: At [facility], you and your health needs are our priority.  As part of our continuing mission to provide you with exceptional heart care, our providers are all part of one team.  This team includes your primary Cardiologist (physician) and Advanced Practice Providers or APPs (Physician Assistants and Nurse Practitioners) who all work together to provide you with the care you need, when you need it.  Your next appointment:   1 year(s)  Provider:   Dorothye Gathers, MD, Slater Duncan, NP, or Neomi Banks, NP    We recommend signing up for the patient portal called "MyChart".  Sign up information is provided on this After Visit Summary.  MyChart is used to connect with patients for Virtual Visits (Telemedicine).  Patients are able to view lab/test results, encounter notes, upcoming appointments, etc.  Non-urgent messages can be sent to your provider as well.   To learn more about what you can do with MyChart, go to [email or web address].   Other Instructions

## 2023-11-16 NOTE — Progress Notes (Signed)
 Cardiology Office Note:  .   Date:  11/16/2023  ID:  JENEA DAKE, DOB [birth year], MRN [id number] PCP: Dickey Fought, PA  [facility] Providers Cardiologist:  Dorothye Gathers, MD    History of Present Illness: Aaron Aas   CONSTANCE WHITTLE is a 68 y.o. female with history of nonobstructive CAD, hyperlipidemia, PVC, remote tobacco use, asymptomatic bradycardia.  Family history notable for coronary artery disease.  Cardiac CTA 08/29/2022 calcium score 186 placing her in the 80th percentile with moderate mixed density plaque 50 to 69% in mid LAD and mild noncalcified plaque 25 to 49% in the mid RCA.  Commended for medical management.  Due to prior statin intolerance (Crestor, Zocor, Lipitor) she was referred to pharmacy lipid clinic PCSK9 initiated.  Discussed the use of AI scribe software for clinical note transcription with the patient, who gave verbal consent to proceed.  History of Present Illness HENSLEY AZIZ is a 68 year old female who presents for follow-up on cholesterol management.  Her total cholesterol has improved since starting Repatha . She administers Repatha  injections in her legs. She continues to take Zetia .  Reports no shortness of breath nor dyspnea on exertion. Reports no chest pain, pressure, or tightness. No edema, orthopnea, PND. Reports no palpitations.  Varicose veins are present on the back of her knees, occasionally throbbing, which she manages by propping her feet up.   Her exercise routine has been reduced due to injuries, but she is resuming activities like walking and hiking. She maintains a balanced diet, limits salt intake, and uses avocado oil for cooking.  ROS: Please see the history of present illness.    All other systems reviewed and are negative.   Studies Reviewed: Aaron Aas   EKG Interpretation Date/Time:  Friday Nov 16 2023 08:07:24 EDT Ventricular Rate:  71 PR Interval:  156 QRS Duration:  74 QT Interval:  390 QTC Calculation: 423 R  Axis:   65  Text Interpretation: Normal sinus rhythm with sinus arrhythmia Normal ECG Confirmed by Neomi Banks ([id number]) on 11/16/2023 8:17:03 AM    Cardiac Studies & Procedures   ______________________________________________________________________________________________   STRESS TESTS  EXERCISE TOLERANCE TEST (ETT) 02/18/2015  Narrative  There was no ST segment deviation noted during stress.   ECHOCARDIOGRAM  ECHOCARDIOGRAM COMPLETE 01/25/2015  Narrative *[facility]* [address] [place], [place] [address] [phone]  ------------------------------------------------------------------- Transthoracic Echocardiography  Patient:    Kingston, Guiles MR #:       [id number] Study Date: 01/25/2015 Gender:     F Age:        59 Height:     170.2 cm Weight:     61.7 kg BSA:        1.71 m^2 Pt. Status: Room:  ATTENDING    Gaylyn Keas, MD ORDERING     Gaylyn Keas, MD REFERRING    Gaylyn Keas, MD SONOGRAPHER  Denece Finger, Will PERFORMING   [facility]  cc:  ------------------------------------------------------------------- LV EF: 55% -   60%  ------------------------------------------------------------------- Indications:      (I34.1).  ------------------------------------------------------------------- History:   PMH:  History of MVP (?). Bradycardia. Palpitations. Acquired from the patient and from the patient&'s chart.  Chest pain.  ------------------------------------------------------------------- Study Conclusions  - Left ventricle: The cavity size was normal. Wall thickness was normal. Systolic function was normal. The estimated ejection fraction was in the range of 55% to 60%. Wall motion was normal; there were no regional wall motion abnormalities.  Transthoracic echocardiography.  M-mode, complete 2D, spectral Doppler, and color Doppler.  Birthdate:  Patient birthdate: February 20, 1956.  Age:  Patient is 68 yr old.  Sex:  Gender:  female. BMI: 21.3 kg/m^2.  Blood pressure:     120/70  Patient status: Outpatient.  Study date:  Study date: 01/25/2015. Study time: 07:46 AM.  Location:  [facility]  -------------------------------------------------------------------  ------------------------------------------------------------------- Left ventricle:  The cavity size was normal. Wall thickness was normal. Systolic function was normal. The estimated ejection fraction was in the range of 55% to 60%. Wall motion was normal; there were no regional wall motion abnormalities.  ------------------------------------------------------------------- Aortic valve:   Structurally normal valve.   Cusp separation was normal.  Doppler:  Transvalvular velocity was within the normal range. There was no stenosis. There was no regurgitation.    VTI ratio of LVOT to aortic valve: 0.73. Valve area (VTI): 1.28 cm^2. Indexed valve area (VTI): 0.75 cm^2/m^2. Peak velocity ratio of LVOT to aortic valve: 0.8. Valve area (Vmax): 1.42 cm^2. Indexed valve area (Vmax): 0.83 cm^2/m^2. Mean velocity ratio of LVOT to aortic valve: 0.77. Valve area (Vmean): 1.36 cm^2. Indexed valve area (Vmean): 0.8 cm^2/m^2.    Mean gradient (S): 4 mm Hg. Peak gradient (S): 8 mm Hg.  ------------------------------------------------------------------- Aorta:  Aortic root: The aortic root was normal in size. Ascending aorta: The ascending aorta was normal in size.  ------------------------------------------------------------------- Mitral valve:   Structurally normal valve.   Leaflet separation was normal.  Doppler:  Transvalvular velocity was within the normal range. There was no evidence for stenosis. There was trivial regurgitation.    Peak gradient (D): 2 mm Hg.  ------------------------------------------------------------------- Left atrium:  The atrium was normal in size.  ------------------------------------------------------------------- Right  ventricle:  The cavity size was normal. Systolic function was normal.  ------------------------------------------------------------------- Pulmonic valve:    The valve appears to be grossly normal. Doppler:  There was trivial regurgitation.  ------------------------------------------------------------------- Tricuspid valve:   Structurally normal valve.   Leaflet separation was normal.  Doppler:  Transvalvular velocity was within the normal range. There was trivial regurgitation.  ------------------------------------------------------------------- Pulmonary artery:   Systolic pressure was within the normal range.  ------------------------------------------------------------------- Right atrium:  The atrium was normal in size.  ------------------------------------------------------------------- Pericardium:  There was no pericardial effusion.  ------------------------------------------------------------------- Measurements  Left ventricle                            Value          Reference LV ID, ED, PLAX chordal                   45.2  mm       43 - 52 LV ID, ES, PLAX chordal                   29.1  mm       23 - 38 LV fx shortening, PLAX chordal            36    %        >=29 LV PW thickness, ED                       9.16  mm       --------- IVS/LV PW ratio, ED                       0.99           <=  1.3 Stroke volume, 2D                         50    ml       --------- Stroke volume/bsa, 2D                     29    ml/m^2   --------- LV e&', lateral                            8.58  cm/s     --------- LV E/e&', lateral                          8.57           --------- LV e&', medial                             6.63  cm/s     --------- LV E/e&', medial                           11.09          --------- LV e&', average                            7.61  cm/s     --------- LV E/e&', average                          9.66           ---------  Ventricular septum                         Value          Reference IVS thickness, ED                         9.05  mm       ---------  LVOT                                      Value          Reference LVOT ID, S                                15    mm       --------- LVOT area                                 1.77  cm^2     --------- LVOT ID                                   15    mm       --------- LVOT peak velocity, S                     114   cm/s     ---------  LVOT mean velocity, S                     74.4  cm/s     --------- LVOT VTI, S                               28    cm       --------- LVOT peak gradient, S                     5     mm Hg    --------- Stroke volume (SV), LVOT DP               49.5  ml       --------- Stroke index (SV/bsa), LVOT DP            29    ml/m^2   ---------  Aortic valve                              Value          Reference Aortic valve peak velocity, S             142   cm/s     --------- Aortic valve mean velocity, S             97    cm/s     --------- Aortic valve VTI, S                       38.6  cm       --------- Aortic mean gradient, S                   4     mm Hg    --------- Aortic peak gradient, S                   8     mm Hg    --------- VTI ratio, LVOT/AV                        0.73           --------- Aortic valve area, VTI                    1.28  cm^2     --------- Aortic valve area/bsa, VTI                0.75  cm^2/m^2 --------- Velocity ratio, peak, LVOT/AV             0.8            --------- Aortic valve area, peak velocity          1.42  cm^2     --------- Aortic valve area/bsa, peak               0.83  cm^2/m^2 --------- velocity Velocity ratio, mean, LVOT/AV             0.77           --------- Aortic valve area, mean velocity          1.36  cm^2     --------- Aortic valve area/bsa, mean               0.8  cm^2/m^2 --------- velocity  Aorta                                     Value          Reference Aortic root ID, ED                        32    mm        --------- Ascending aorta ID, A-P, S                34    mm       ---------  Left atrium                               Value          Reference LA ID, A-P, ES                            31    mm       --------- LA ID/bsa, A-P                            1.82  cm/m^2   <=2.2 LA volume, S                              44    ml       --------- LA volume/bsa, S                          25.8  ml/m^2   --------- LA volume, ES, 1-p A4C                    43    ml       --------- LA volume/bsa, ES, 1-p A4C                25.2  ml/m^2   --------- LA volume, ES, 1-p A2C                    42    ml       --------- LA volume/bsa, ES, 1-p A2C                24.6  ml/m^2   ---------  Mitral valve                              Value          Reference Mitral E-wave peak velocity               73.5  cm/s     --------- Mitral A-wave peak velocity               63.2  cm/s     --------- Mitral deceleration time                  218   ms       150 - 230 Mitral peak gradient, D                   2  mm Hg    --------- Mitral E/A ratio, peak                    1.2            ---------  Pulmonary arteries                        Value          Reference PA pressure, S, DP                        21    mm Hg    <=30  Tricuspid valve                           Value          Reference Tricuspid regurg peak velocity            210   cm/s     --------- Tricuspid peak RV-RA gradient             18    mm Hg    ---------  Systemic veins                            Value          Reference Estimated CVP                             3     mm Hg    ---------  Right ventricle                           Value          Reference RV pressure, S, DP                        21    mm Hg    <=30 RV s&', lateral, S                         11.1  cm/s     ---------  Legend: (L)  and  (H)  mark values outside specified reference range.  ------------------------------------------------------------------- Prepared and  Electronically Authenticated by  Ahmad Alert, M.D. 2016-08-01T09:34:55    MONITORS  CARDIAC EVENT MONITOR 01/25/2015  Narrative  Normal sinus rhythm with average heart rate 65 bpm  Occasional PVC's   CT SCANS  CT CORONARY MORPH W/CTA COR W/SCORE 09/18/2022  Addendum 09/20/2022  3:05 AM ADDENDUM REPORT: 09/20/2022 03:02  EXAM: OVER-READ INTERPRETATION  CT CHEST  The following report is an over-read performed by radiologist Dr. Violeta Grey of [facility] on 09/20/2022. This over-read does not include interpretation of cardiac or coronary anatomy or pathology. The coronary calcium score/coronary CTA interpretation by the cardiologist is attached.  COMPARISON:  None.  FINDINGS: Cardiovascular: Mild atherosclerotic calcifications of the thoracic aorta are noted. No pulmonary emboli are seen.  Mediastinum/Nodes: There are no enlarged lymph nodes within the visualized mediastinum.  Lungs/Pleura: There is no pleural effusion. The visualized lungs appear clear.  Upper abdomen: No significant findings in the visualized upper abdomen.  Musculoskeletal/Chest wall: No chest wall mass or suspicious osseous findings within the visualized chest.  IMPRESSION: Aortic Atherosclerosis (ICD10-I70.0).  Electronically Signed By: Violeta Grey M.D. On: 09/20/2022 03:02  Narrative CLINICAL DATA:  Chest pain  EXAM: Cardiac/Coronary CTA  TECHNIQUE: A non-contrast, gated CT scan was obtained with axial slices of 3 mm through the heart for calcium scoring. Calcium scoring was performed using the Agatston method. A 120 kV prospective, gated, contrast cardiac scan was obtained. Gantry rotation speed was 250 msecs and collimation was 0.6 mm. Two sublingual nitroglycerin  tablets (0.8 mg) were given. The 3D data set was reconstructed in 5% intervals of the 35-75% of the R-R cycle. Diastolic phases were analyzed on a dedicated workstation using MPR, MIP, and VRT modes.  The patient received 95 cc of contrast.  FINDINGS: Image quality: Excellent.  Noise artifact is: Limited.  Coronary Arteries:  Normal coronary origin.  Right dominance.  Left main: The left main is a large caliber vessel with a normal take off from the left coronary cusp that bifurcates to form a left anterior descending artery and a left circumflex artery. There is no plaque or stenosis.  Left anterior descending artery: The proximal LAD contains minimal calcified plaque (<25%). The mid LAD contains moderate mixed density plaque (50-69%). The distal LAD is patent. A distal LAD myocardial bridge is present (normal variant). The LAD gives off 1 large diagonal branch with mild calcified plaque (25-49%).  Left circumflex artery: The LCX is non-dominant and patent with no evidence of plaque or stenosis. The LCX gives off 1 patent obtuse marginal branch.  Right coronary artery: The RCA is dominant with normal take off from the right coronary cusp. The mid RCA contains mild non-calcified plaque (25-49%). The RCA terminates as a PDA and right posterolateral branch but these vessels are too small for analysis.  Right Atrium: Right atrial size is within normal limits.  Right Ventricle: The right ventricular cavity is within normal limits.  Left Atrium: Left atrial size is normal in size with no left atrial appendage filling defect.  Left Ventricle: The ventricular cavity size is within normal limits.  Pulmonary arteries: Normal in size without proximal filling defect.  Pulmonary veins: Normal pulmonary venous drainage.  Pericardium: Normal thickness without significant effusion or calcium present.  Cardiac valves: The aortic valve is trileaflet without significant calcification. The mitral valve is normal without significant calcification.  Aorta: Normal caliber without significant disease.  Extra-cardiac findings: See attached radiology report for non-cardiac  structures.  IMPRESSION: 1. Coronary calcium score of 186. This was 88th percentile for age-, sex, and race-matched controls.  2. Normal coronary origin with right dominance.  3. Moderate mixed density plaque (50-69%) in the mid LAD.  4. Mild non-calcified plaque (25-49%) in the mid RCA.  RECOMMENDATIONS: 1. Moderate stenosis. Consider symptom-guided anti-ischemic pharmacotherapy as well as risk factor modification per guideline directed care. Additional analysis with CT FFR will be submitted.  Jackquelyn Mass, MD  Electronically Signed: By: Jackquelyn Mass M.D. On: 09/18/2022 12:34     ______________________________________________________________________________________________      Risk Assessment/Calculations:             Physical Exam:   VS:  BP 110/70 (BP Location: Right Arm, Patient Position: Sitting, Cuff Size: Normal)   Pulse 71   Ht 5\' 7"  (1.702 m)   Wt 145 lb 3.2 oz (65.9 kg)   SpO2 98%   BMI 22.74 kg/m    Wt Readings from Last 3 Encounters:  11/16/23 145 lb 3.2 oz (65.9 kg)  09/06/22 140 lb (63.5 kg)  08/08/22 140 lb (63.5 kg)  GEN: Well nourished, well developed in no acute distress NECK: No JVD; No carotid bruits CARDIAC: RRR, no murmurs, rubs, gallops RESPIRATORY:  Clear to auscultation without rales, wheezing or rhonchi  ABDOMEN: Soft, non-tender, non-distended EXTREMITIES:  No edema; No deformity   ASSESSMENT AND PLAN: .    CAD / HLD, LDL goal <70 - 08/2023 LDL 40.[id number] with no anginal symptoms. No indication for ischemic evaluation.   GDMT Repatha  140mg  q14 days, Zetia 10mg  daily.  We did discuss that if she has on Repatha  could consider discontinuing Zetia, however she feels overall well about the medication she prefers to continue both.  Varicose veins - present in back of knees. Discussed referral to [facility] if varicose veins are painful, reports overall not bothersome and she will contact us  if she is interested in referral in the future.         Dispo: follow up in 1 year  Signed, Clearnce Curia, NP

## 2023-11-22 ENCOUNTER — Ambulatory Visit: Payer: 59 | Admitting: Cardiology

## 2024-01-07 ENCOUNTER — Other Ambulatory Visit: Payer: Self-pay | Admitting: Physician Assistant

## 2024-01-07 DIAGNOSIS — Z1231 Encounter for screening mammogram for malignant neoplasm of breast: Secondary | ICD-10-CM

## 2024-01-31 ENCOUNTER — Ambulatory Visit
Admission: RE | Admit: 2024-01-31 | Discharge: 2024-01-31 | Disposition: A | Discharge status: Home / Self Care | Source: Ambulatory Visit | Attending: Physician Assistant | Admitting: Physician Assistant

## 2024-01-31 DIAGNOSIS — Z1231 Encounter for screening mammogram for malignant neoplasm of breast: Secondary | ICD-10-CM

## 2024-02-06 ENCOUNTER — Telehealth: Payer: Self-pay | Admitting: Cardiology

## 2024-02-06 NOTE — Telephone Encounter (Signed)
 Pt c/o medication issue:  1. Name of Medication:   Evolocumab  (REPATHA  SURECLICK) 140 MG/ML SOAJ   2. How are you currently taking this medication (dosage and times per day)?   3. Are you having a reaction (difficulty breathing--STAT)?   4. What is your medication issue?   Patient stated when she took her last injection it did not work correctly and she did not here the click at the end although it looked like the medication dispensed.  Patient stated she spoke with the company and they recommended she follow-up with her cardiologist.  Patient wants a call back to discuss next steps.

## 2024-04-27 ENCOUNTER — Other Ambulatory Visit: Payer: Self-pay | Admitting: Student

## 2024-04-27 DIAGNOSIS — Z789 Other specified health status: Secondary | ICD-10-CM

## 2024-04-27 DIAGNOSIS — E785 Hyperlipidemia, unspecified: Secondary | ICD-10-CM

## 2024-04-27 DIAGNOSIS — I2510 Atherosclerotic heart disease of native coronary artery without angina pectoris: Secondary | ICD-10-CM

## 2024-04-28 ENCOUNTER — Telehealth: Payer: Self-pay | Admitting: Cardiology

## 2024-04-28 DIAGNOSIS — E785 Hyperlipidemia, unspecified: Secondary | ICD-10-CM

## 2024-04-28 DIAGNOSIS — Z789 Other specified health status: Secondary | ICD-10-CM

## 2024-04-28 DIAGNOSIS — I2510 Atherosclerotic heart disease of native coronary artery without angina pectoris: Secondary | ICD-10-CM

## 2024-04-28 MED ORDER — REPATHA SURECLICK 140 MG/ML ~~LOC~~ SOAJ: 140.0000 mg | SUBCUTANEOUS | 3 refills | Status: AC

## 2024-04-28 NOTE — Telephone Encounter (Signed)
 Pt c/o medication issue:  1. Name of Medication:  Evolocumab  (REPATHA  SURECLICK) 140 MG/ML SOAJ   2. How are you currently taking this medication (dosage and times per day)? As written   3. Are you having a reaction (difficulty breathing--STAT)? No   4. What is your medication issue? Carol Kerr. With [employer] called in stating she needs a PA for this med. She asked if it can be put as an urgent request.

## 2024-04-28 NOTE — Telephone Encounter (Signed)
*  STAT* If patient is at the pharmacy, call can be transferred to refill team.   1. Which medications need to be refilled? (please list name of each medication and dose if known)   Evolocumab  (REPATHA  SURECLICK) 140 MG/ML SOAJ    2. Which pharmacy/location (including street and city if local pharmacy) is medication to be sent to? [id number] - [place], [place] - [address] Phone: [phone]  Fax: [phone]      3. Do they need a 30 day or 90 day supply? 90 Pt is out of medication

## 2024-04-29 ENCOUNTER — Other Ambulatory Visit (HOSPITAL_COMMUNITY): Payer: Self-pay

## 2024-04-29 ENCOUNTER — Telehealth: Payer: Self-pay | Admitting: Pharmacy Technician

## 2024-04-29 NOTE — Telephone Encounter (Signed)
 Pharmacy Patient Advocate Encounter   Received notification from Physician's Office that prior authorization for repatha  is required/requested.   Insurance verification completed.   The patient is insured through [employer].   Per test claim: PA required; PA submitted to above mentioned insurance via Prompt PA Key/confirmation #/EOC [id number] Status is pending

## 2024-04-29 NOTE — Telephone Encounter (Signed)
 Pharmacy Patient Advocate Encounter  Received notification from [employer] that Prior Authorization for Repatha  has been APPROVED from 04/29/24 to 04/28/26   PA #/Case ID/Reference #: [id number]
